# Patient Record
Sex: Male | Born: 1955 | Race: Black or African American | Hispanic: No | Smoking: Never smoker
Health system: Southern US, Community
[De-identification: ages and names within clinical notes are randomized; demographics above are authoritative.]

## PROBLEM LIST (undated history)

## (undated) DIAGNOSIS — C801 Malignant (primary) neoplasm, unspecified: Secondary | ICD-10-CM

## (undated) DIAGNOSIS — D099 Carcinoma in situ, unspecified: Secondary | ICD-10-CM

---

## 2006-08-06 ENCOUNTER — Encounter: Admission: RE | Admit: 2006-08-06 | Discharge: 2006-08-06 | Payer: Self-pay | Admitting: Orthopedic Surgery

## 2018-09-30 ENCOUNTER — Emergency Department (HOSPITAL_COMMUNITY)
Admission: EM | Admit: 2018-09-30 | Discharge: 2018-09-30 | Disposition: A | Discharge status: Home / Self Care | Payer: Self-pay | Attending: Emergency Medicine | Admitting: Emergency Medicine

## 2018-09-30 ENCOUNTER — Encounter (HOSPITAL_COMMUNITY): Payer: Self-pay | Admitting: Emergency Medicine

## 2018-09-30 ENCOUNTER — Other Ambulatory Visit: Payer: Self-pay

## 2018-09-30 DIAGNOSIS — H02402 Unspecified ptosis of left eyelid: Secondary | ICD-10-CM

## 2018-09-30 DIAGNOSIS — R221 Localized swelling, mass and lump, neck: Secondary | ICD-10-CM

## 2018-09-30 DIAGNOSIS — R220 Localized swelling, mass and lump, head: Secondary | ICD-10-CM | POA: Insufficient documentation

## 2018-09-30 NOTE — Discharge Instructions (Addendum)
You need to follow-up with an ophthalmologist for your drooping eyelid.  You also need to have your neck mass further evaluated, and likely biopsied.  Please contact the ENT listed.

## 2018-09-30 NOTE — ED Notes (Signed)
Pt stable, ambulatory, and verbalizes understanding of d/c instructions.  

## 2018-09-30 NOTE — ED Provider Notes (Signed)
[facility] EMERGENCY DEPARTMENT Provider Note   CSN: [id number] Arrival date & time: 09/30/18  2119     History   Chief Complaint No chief complaint on file.   HPI Jack Price is a 62 y.o. male.  Patient presents to the emergency department with a chief complaint of droopy left eyelid.  He states that he noticed the symptoms yesterday morning while looking in the mirror.  He denies any double vision, blurry vision, pain, numbness, weakness, or tingling.  Denies any history of Bell's palsy or stroke.  Denies any headache.  Denies any trauma.  No treatments prior to arrival.  Additionally, states that he has had a mass on the left side of his neck for the past 4 months.  He has not had this evaluated.  It is not painful.  The history is provided by the patient. No language interpreter was used.    History reviewed. No pertinent past medical history.  There are no active problems to display for this patient.       Home Medications    Prior to Admission medications   Not on File    Family History History reviewed. No pertinent family history.  Social History Social History   Tobacco Use  . Smoking status: Not on file  Substance Use Topics  . Alcohol use: Not on file  . Drug use: Not on file     Allergies   Patient has no known allergies.   Review of Systems Review of Systems  All other systems reviewed and are negative.    Physical Exam Updated Vital Signs BP (!) 142/83 (BP Location: Right Arm)   Pulse 79   Temp 97.7 F (36.5 C) (Oral)   Resp 19   Ht 5\' 11"  (1.803 m)   Wt 90.7 kg   SpO2 97%   BMI 27.89 kg/m   Physical Exam Vitals signs and nursing note reviewed.  Constitutional:      Appearance: He is well-developed.  HENT:     Head: Normocephalic and atraumatic.     Comments: Normal creases in forehead, normal nasolabial fold, no facial droop Eyes:     General: No scleral icterus.       Right eye: No discharge.          Left eye: No discharge.     Conjunctiva/sclera: Conjunctivae normal.     Pupils: Pupils are equal, round, and reactive to light.     Comments: Left-sided ptosis, normal EOMs, PERRL, diminished crease in left upper eyelid, worse with downward gaze, no stye or sign of infection  Neck:     Musculoskeletal: Normal range of motion and neck supple.     Vascular: No JVD.  Cardiovascular:     Rate and Rhythm: Normal rate and regular rhythm.     Heart sounds: Normal heart sounds. No murmur. No friction rub. No gallop.   Pulmonary:     Effort: Pulmonary effort is normal. No respiratory distress.     Breath sounds: Normal breath sounds. No wheezing or rales.  Chest:     Chest wall: No tenderness.  Abdominal:     General: There is no distension.     Palpations: Abdomen is soft. There is no mass.     Tenderness: There is no abdominal tenderness. There is no guarding or rebound.  Musculoskeletal: Normal range of motion.        General: No tenderness.  Skin:    General: Skin is warm and dry.  Neurological:     Mental Status: He is alert and oriented to person, place, and time.  Psychiatric:        Behavior: Behavior normal.        Thought Content: Thought content normal.        Judgment: Judgment normal.      ED Treatments / Results  Labs (all labs ordered are listed, but only abnormal results are displayed) Labs Reviewed - No data to display  EKG None  Radiology No results found.  Procedures Procedures (including critical care time)  Medications Ordered in ED Medications - No data to display   Initial Impression / Assessment and Plan / ED Course  I have reviewed the triage vital signs and the nursing notes.  Pertinent labs & imaging results that were available during my care of the patient were reviewed by me and considered in my medical decision making (see chart for details).    Patient with left sided ptosis.  Noticed the drooping yesterday morning in the mirror.   Denies any double vision, denies any pain.  There is no softening of his nasolabial fold or forehead lines, doubt Bell's palsy, doubt stroke, no weakness, numbness, or tingling.  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  Doubt Horner syndrome or 3rd nerve palsy.  Regarding the patient's left-sided neck mass, suspect that this is a nontender lymph node, given the length of time that it has been present (4 months), will recommend that he have follow-up with ENT for biopsy and further work-up.  Patient understands and agrees with plan.  Patient seen by discussed with Dr. Winfred Leeds, who offered additional workup for the neck mass with labs and CT, but patient declined.  Stressed outpatient follow-up.  Final Clinical Impressions(s) / ED Diagnoses   Final diagnoses:  Ptosis of left eyelid  Mass of neck    ED Discharge Orders    None       Montine Circle, PA-C 09/30/18 2258    Orlie Dakin, MD 10/01/18 [phone]

## 2018-09-30 NOTE — ED Triage Notes (Signed)
Pt states he has had a lump on the left side of his neck for a month or so. He noticed that Tuesday morning he noticed left eye lid weakness. No other symptoms. He states he can see fine just the lid is dragging. No ear pain or history of Bell Palsy

## 2018-09-30 NOTE — ED Provider Notes (Addendum)
She reports drooping of his left upper eyelid for the past 4 days.  He also reports a nonpainful mass in his left neck for the past 4 months.  He denies any difficulty swallowing denies visual changes denies difficulty speaking denies fever denies voice changes or difficulty breathing.  No other associated complaint.  On exam patient is alert awake Glasgow Coma Score 15 HEENT there is mild ptosis of left upper eyelid otherwise no facial asymmetry.  Extraocular muscles are intact pupils equal round reactive to light.  Neck there is a golf ball size mass at the left anterolateral neck which is nontender and movable.   Offered diagnostic testing to the patient including blood work and CT imaging which he declines.  He prefers referral to ENT specialist and have outpatient evaluation from office.   I did advise pt possibiilty of tumor in his neck .  He does not wish to have work up now and I feel no emergent work up needed, however needs folllow up. P understands and agrees  Orlie Dakin, MD 09/30/18 2924    Orlie Dakin, MD 10/01/18 [phone]

## 2019-02-10 ENCOUNTER — Telehealth: Payer: Self-pay | Admitting: *Deleted

## 2019-02-10 ENCOUNTER — Other Ambulatory Visit: Payer: Self-pay | Admitting: Radiation Oncology

## 2019-02-10 ENCOUNTER — Other Ambulatory Visit: Payer: Self-pay | Admitting: *Deleted

## 2019-02-10 DIAGNOSIS — D4989 Neoplasm of unspecified behavior of other specified sites: Secondary | ICD-10-CM

## 2019-02-10 NOTE — Telephone Encounter (Signed)
Oncology Nurse Navigator Documentation  Placed introductory call to new referral patient Jack Price.  Introduced myself as the H&N oncology nurse navigator that works with Drs. Isidore Moos and Maylon Peppers to whom he has been referred by ENT Dr. Janace Hoard.  He confirmed understanding of referral.  Briefly explained my role as his navigator, provided my contact information.   Confirmed understanding of appts to be scheduled: PET at [facility] followed by appts with Drs. Tillie Fantasia, baseline audiology s/p appt with Dr. Maylon Peppers and his recommendation of HD cisplatin.  I explained the purpose of pre-radiotherapy dental evaluation, indicated he would be contacted by [place] to arrange an appt within a day or so of his appt with Dr. Isidore Moos.   He confirmed understanding of [place] and [place] location, voiced understanding COVID-19 mitigation arrival and registration procedures.    I encouraged him to call with questions/concerns as he moves forward with appts and procedures.    He verbalized understanding of information provided, expressed appreciation for my call.  Navigator Initial Assessment . 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 . Support System: Lives with fiance. Marland Kitchen PCP:   . PCD:  None, has not seen dentist since his died several years ago.  Indicated teeth in "excellent" condition, denied problems. . Transportation Needs: No . Sensory Deficits/Language Barriers/Interpreter Needed:  No . Ambulation Needs: No . DME Used in Home:  No . Psychosocial Needs: No  . Concerns/Needs Understanding Cancer:  Navigator answered questions. . Self-Expressed Needs: No  Gayleen Orem, RN, BSN Head & Neck Oncology Nurse [place] at [place] [phone]

## 2019-02-11 ENCOUNTER — Ambulatory Visit
Admission: RE | Admit: 2019-02-11 | Discharge: 2019-02-11 | Disposition: A | Discharge status: Home / Self Care | Payer: Self-pay | Source: Ambulatory Visit | Attending: Radiation Oncology | Admitting: Radiation Oncology

## 2019-02-11 ENCOUNTER — Other Ambulatory Visit: Payer: Self-pay | Admitting: Radiation Oncology

## 2019-02-11 DIAGNOSIS — R221 Localized swelling, mass and lump, neck: Secondary | ICD-10-CM

## 2019-02-15 ENCOUNTER — Telehealth: Payer: Self-pay | Admitting: Radiation Oncology

## 2019-02-15 ENCOUNTER — Telehealth: Payer: Self-pay | Admitting: *Deleted

## 2019-02-15 NOTE — Telephone Encounter (Signed)
New Message: ° ° °LVM for patient to return call to schedule appt from referral received. °

## 2019-02-15 NOTE — Telephone Encounter (Signed)
Oncology Nurse Navigator Documentation  In follow-up to conversation with Mr. Gerry Friday of last week and his indication PET scheduled for 6/2, I spoke with Hiram Comber, Radiology Centralized Scheduling, coordinated reschedule for 5/12 7:00.  Per her guidance, I will call again tomorrow morning to see if this Thursday 4:00 is viable option.  LVMM for Mr. Desena with the above information.  Gayleen Orem, RN, BSN Head & Neck Oncology Nurse [place] at [place] [phone]

## 2019-02-17 ENCOUNTER — Telehealth: Payer: Self-pay | Admitting: *Deleted

## 2019-02-17 NOTE — Telephone Encounter (Signed)
Oncology Nurse Navigator Documentation  Called Jack Price to inform him of:  Tomorrow's confirmed PET at [facility], 10:00 arrival, low-carb meal this evening, NPO beginning midnight except for water sips with morning medications.  He confirmed he is not diabetic.  Friday's confirmed 2:30 NE, 3:00 Consult with Dr. Isidore Moos.  Friday's tentative 9:00 Consult with Dr. Maylon Peppers, [facility].  He voiced understanding I will call him tomorrow to confirm. He confirmed understanding of facility locations and my explanation of arrival/registration procedures.  Gayleen Orem, RN, BSN Head & Neck Oncology Nurse [place] at [place] [phone]

## 2019-02-18 ENCOUNTER — Other Ambulatory Visit: Payer: Self-pay | Admitting: Hematology

## 2019-02-18 ENCOUNTER — Other Ambulatory Visit: Payer: Self-pay

## 2019-02-18 ENCOUNTER — Ambulatory Visit (HOSPITAL_COMMUNITY)
Admission: RE | Admit: 2019-02-18 | Discharge: 2019-02-18 | Disposition: A | Discharge status: Home / Self Care | Payer: BLUE CROSS/BLUE SHIELD | Source: Ambulatory Visit | Attending: Radiation Oncology | Admitting: Radiation Oncology

## 2019-02-18 ENCOUNTER — Encounter: Payer: Self-pay | Admitting: *Deleted

## 2019-02-18 DIAGNOSIS — C01 Malignant neoplasm of base of tongue: Secondary | ICD-10-CM

## 2019-02-18 DIAGNOSIS — D4989 Neoplasm of unspecified behavior of other specified sites: Secondary | ICD-10-CM | POA: Diagnosis present

## 2019-02-18 LAB — GLUCOSE, CAPILLARY: Glucose-Capillary: 94 mg/dL (ref 70–99)

## 2019-02-18 MED ORDER — FLUDEOXYGLUCOSE F - 18 (FDG) INJECTION
9.9500 | Freq: Once | INTRAVENOUS | Status: AC | PRN
  Administered 2019-02-18: 9.95 via INTRAVENOUS

## 2019-02-18 NOTE — Progress Notes (Signed)
New patient packet give to Dr. Lanell Persons nurse.

## 2019-02-18 NOTE — Progress Notes (Addendum)
Jack Price NOTE  Patient Care Team: Patient, [name] as PCP - General (General Practice) Melissa Montane, MD as Consulting Physician (Otolaryngology) Eppie Gibson, MD as Attending Physician (Radiation Oncology) Tish Men, MD as Consulting Physician (Hematology) Leota Sauers, RN as Oncology Nurse Navigator  HEME/ONC OVERVIEW: 1. 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, stage TBD  -12/2018: CT neck showed a left BOT lesion extending to the left vallecula, large left Level II/III multilobulated necrotic nodal conglomerate up to 5.3cm, resulting in encasement and displacement of the left carotid sheath, occlusion of the left IJ vein, and it is inseparable from multiple adjacent structures; suspicious for R cervical LN involvement  -01/2019: FNA of the left cervical LN showed poorly differentiated carcinoma, favoring squamous cell carcinoma -02/2019: PET showed FDG-avid left tongue lesion with advanced, bulky bilateral cervical LN disease, as well as a 3.2cm FDG-avid liver lesion, concerning for metastatic disease   ASSESSMENT & PLAN:   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 Janace Hoard of ENT in 12/2018 for evaluation of enlarging left neck mass x 9 months, and CT neck showed suspicious malignancy in the left base of the tongue with extensive left cervical lymph node involvement, resulting in encasement of displacement and displacement of the left carotid sheath, occlusion of the left IJ vein, and is inseparable from multiple adjacent structures.  In addition, there was suspicion for right cervical lymph node involvement.  Unfortunately, PET on 02/18/2019 showed  FDG-avid left  tongue lesion with advanced, bulky bilateral cervical LN disease, as well as a 3.2cm FDG-avid liver lesion, concerning for metastatic disease  -I reviewed pathology imaging results in detail with the patient -We also discussed at length the NCCN guideline -Given the suspicious liver lesion in the setting of very locally advanced, poorly differentiated carcinoma of the base of the tongue, this is very concerning for metastatic disease; alternatively, this may represent a second primary malignancy or its metastasis, but PET did not show any evidence of cirrhosis, which would raise the possibility of HCC, or other significant primary lesions, such lung, stomach, colon or prostate -I have ordered AFP today; if elevated, then we may consider MRI abdomen to evaluate for possible HCC -However, if AFP is not significantly elevated, I would pursue CT-guided liver bx to assess for possible metastatic disease -If metastatic disease is confirmed, I would recommend starting with systemic therapy first, and the disease is stable or improving, then we can consider adding consolidative RT or salvage surgery (to the primary site or oligometastasis) -After the lengthy discussion, the patient expressed understanding and agreed with the plan to pursue liver biopsy next if AFP is not significantly elevated   Loss of consciousness -Patient reports passing out for 20 minutes in the early morning but did not have any bladder or bowel incontinence -I suspect it may be 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, causing transient decreased blood flow when the patient stood up from the bed -However, given the very locally advanced tumor, metastatic disease is a concern -Therefore, I have ordered STAT head CT w/o contrast to rule out any acute intracranial lesions  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 CT HEAD WO CONTRAST    Standing Status:   Future    Standing Expiration Date:   02/19/2020    Order Specific Question:   Preferred imaging location?    Answer:   [facility] Specific Question:   Radiology Contrast Protocol - do NOT remove file path    Answer:   \\[email or web address].[email or web address]  . DG Knee Complete 4 Views Left    Standing Status:   Future    Number of Occurrences:   1    Standing Expiration Date:   04/20/2020    Order Specific Question:   Reason for Exam (SYMPTOM  OR DIAGNOSIS REQUIRED)    Answer:   Left knee pain following fall    Order Specific Question:   Preferred imaging location?    Answer:   [facility] Specific Question:   Radiology Contrast Protocol - do NOT remove file path    Answer:   \\[email or web address]\DXFluoroContrastProtocols.pdf   All questions were answered. The patient knows to call the clinic with any problems, questions or concerns.  Return to be determined, pending the AFP level and if the patient requires liver bx.   Tish Men, MD 02/19/2019 10:25 AM   CHIEF COMPLAINTS/PURPOSE OF CONSULTATION:  "I passed out this morning"  HISTORY OF PRESENTING ILLNESS:  Jack Price 63 y.o. 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  Patient presented to Dr. Janace Hoard of ENT in 12/2018 for evaluation of an enlarging  left neck mass x 9 months, associated with mild dysphagia.  He denied any associated fever, night sweats, or weight change.  CT neck showed suspicious primary malignancy in the left base of the tongue, with extensive left cervical nodal disease resulting in encasement of left carotid sheath, occlusion of the left IJ vein, and is inseparable from multiple adjacent structures.  Patient underwent FNA of the left cervical lymph node in 01/2019, which showed poorly differentiated carcinoma, favor squamous cell carcinoma.  Patient reports that he was getting up this morning to use the bathroom, felt a wave of nausea, and passed out.  He woke up 20 minutes later on the floor.  He denied any preceding headache, vision change, and after the fall, he did not have any bladder or bowel incontinence or unilateral extremity weakness/numbness/tingling.  He bruised his left knee, and his knee and left knee remains very sore this morning clinic.  He is able to bear some weight ,but has limited range of motion in the left knee.  I have reviewed his chart and materials related to his cancer extensively and collaborated history with the patient. Summary of oncologic history is as follows:   Primary squamous cell carcinoma of base of tongue ([place])   01/08/2019 Imaging    CT neck: Findings are highly concerning for left tongue base malignancy with the largest  left level II/III metastatic nodal conglomerate measuring up to 5.3 cm in greatest axial dimension resulting in encasement and displacement of the left carotid sheath, occlusion of the left internal jugular vein, and is inseparable from multiple adjacent structures as detailed in the body of the report.  Findings are concerning for additional right level IIA, right level II/III, left level III, right level IV, and possibly subcentimeter left level V nodal metastases as detailed above. PET/CT could further evaluate the extent of metastatic disease if clinically warranted.     01/21/2019 Pathology Results    Final Cytologic Interpretation  Left neck, Fine Needle Aspiration I (smears and cell block): Porrly differentiatd carcinoma, favor squamous cell carcinoma. See comment.    02/18/2019 Imaging    PET: IMPRESSION: 16 mm lesion at the left base of tongue, corresponding to the patient's known primary head/neck malignancy, similar to prior CT.  Bilateral cervical nodal metastases, as described above, including a dominant 5.3 cm left level 2/3 nodal mass, grossly unchanged.  3.2 cm hepatic metastasis in segment 4A.     MEDICAL HISTORY:  History reviewed. No pertinent past medical history.  SURGICAL HISTORY: History reviewed. No pertinent surgical history.  SOCIAL HISTORY: Social History   Socioeconomic History  . Marital status: Significant Other    Spouse name: Not on file  . Number of children: Not on file  . Years of education: Not on file  . Highest education level: Not on file  Occupational History  . Not on file  Social Needs  . Financial resource strain: Not on file  . Food insecurity:    Worry: Not on file    Inability: Not on file  . Transportation needs:    Medical: Not on file    Non-medical: Not on file  Tobacco Use  . Smoking status: Never Smoker  . Smokeless tobacco: Never Used  Substance and Sexual Activity  . Alcohol use: Not Currently  . Drug use: Never  . Sexual activity: Yes  Lifestyle  . Physical activity:    Days per week: Not on file    Minutes per session: Not on file  . Stress: Not on file  Relationships  . Social connections:    Talks on phone: Not on file    Gets together: Not on file    Attends religious service: Not on file    Active member of club or organization: Not on file    Attends meetings of clubs or organizations: Not on file    Relationship status: Not on file  . Intimate partner violence:    Fear of current or ex partner: Not on file    Emotionally abused: Not on file     Physically abused: Not on file    Forced sexual activity: Not on file  Other Topics Concern  . Not on file  Social History Narrative  . Not on file    FAMILY HISTORY: History reviewed. No pertinent family history.  ALLERGIES:  is allergic to iodinated diagnostic agents.  MEDICATIONS:  Current Outpatient Medications  Medication Sig Dispense Refill  . acetaminophen (TYLENOL) 325 MG tablet Take 650 mg by mouth every 4 (four) hours as needed for mild pain.     No current facility-administered medications for this visit.     REVIEW OF SYSTEMS:   Constitutional: ( - ) fevers, ( - )  chills , ( - ) night sweats Eyes: ( - ) blurriness of vision, ( - ) double vision, ( - ) watery  eyes Ears, nose, mouth, throat, and face: ( - ) mucositis, ( - ) sore throat Respiratory: ( - ) cough, ( - ) dyspnea, ( - ) wheezes Cardiovascular: ( - ) palpitation, ( - ) chest discomfort, ( - ) lower extremity swelling Gastrointestinal:  ( - ) nausea, ( - ) heartburn, ( - ) change in bowel habits Skin: ( - ) abnormal skin rashes Lymphatics: ( - ) new lymphadenopathy, ( - ) easy bruising Neurological: ( - ) numbness, ( - ) tingling, ( - ) new weaknesses Behavioral/Psych: ( - ) mood change, ( - ) new changes  All other systems were reviewed with the patient and are negative.  PHYSICAL EXAMINATION: ECOG PERFORMANCE STATUS: 0 - Asymptomatic  Vitals:   02/19/19 0925  BP: (!) 145/93  Pulse: 72  Resp: 18  Temp: 99 F (37.2 C)  SpO2: 100%   Filed Weights   02/19/19 0925  Weight: 203 lb 12.8 oz (92.4 kg)    GENERAL: alert, no distress and comfortable SKIN: skin color, texture, turgor are normal, no rashes or significant lesions EYES: slightly droopy left eyelid, slightly sluggish left pupil response to light, consistent with Horner's syndrome  OROPHARYNX: no exudate, no erythema; lips, buccal mucosa, and tongue normal  NECK: supple, non-tender LYMPH:  Bulky bilateral cervical adenopathy, L > R; large  firm mass in the left neck, fixed  LUNGS: clear to auscultation with normal breathing effort HEART: regular rate & rhythm, no murmurs, no lower extremity edema ABDOMEN: soft, non-tender, non-distended, normal bowel sounds Musculoskeletal: no cyanosis of digits and no clubbing  PSYCH: alert & oriented x 3, fluent speech NEURO: no focal motor/sensory deficits  LABORATORY DATA:  I have reviewed the data as listed Lab Results  Component Value Date   WBC 8.7 02/19/2019   HGB 13.6 02/19/2019   HCT 40.1 02/19/2019   MCV 95.5 02/19/2019   PLT 208 02/19/2019   Lab Results  Component Value Date   NA 136 02/19/2019   K 4.0 02/19/2019   CL 101 02/19/2019   CO2 27 02/19/2019    RADIOGRAPHIC STUDIES: I have personally reviewed the radiological images as listed and agreed with the findings in the report. Nm Pet Image Initial (pi) Skull Base To Thigh  Result Date: 02/18/2019 CLINICAL DATA:  Subsequent treatment strategy for left base of tongue/left neck squamous cell cancer. EXAM: NUCLEAR MEDICINE PET SKULL BASE TO THIGH TECHNIQUE: 9.95 mCi F-18 FDG was injected intravenously. Full-ring PET imaging was performed from the skull base to thigh after the radiotracer. CT data was obtained and used for attenuation correction and anatomic localization. Fasting blood glucose: 94 mg/dl COMPARISON:  [facility] CT neck dated 01/08/2019 FINDINGS: Mediastinal blood pool activity: SUV max 2.5 NECK: 16 mm lesion at the left base of tongue (series 4/image 30), max SUV 24.9, likely corresponding to the patient's known primary head/neck malignancy. This previously measured 18 mm on CT. 5.3 x 3.7 cm left level 2/3 aggregate nodal mass, partially necrotic (series 4/image 32), max SUV 18.0. This previously measured 5.4 x 3.8 cm on CT. Additional bilateral cervical lymphadenopathy, measuring up to 12 mm short axis on the right (series 4/image 29) and 12 mm short on the left (series 4/image 38), better evaluated on  CT. Max SUV 5.5 on the right, max SUV 7.3 on the left. Incidental CT findings: none CHEST: No suspicious pulmonary nodules. Mild linear scarring/atelectasis in the bilateral lower lobes. No hypermetabolic thoracic lymphadenopathy. Incidental CT findings: Mild coronary  atherosclerosis in the left circumflex. ABDOMEN/PELVIS: 3.2 cm mass in segment 4A (series 4/image 106), max SUV 10.7, compatible with metastasis. No abnormal hypermetabolism in the spleen, pancreas, or adrenal glands. No hypermetabolic abdominopelvic lymphadenopathy. Incidental CT findings: Left renal sinus cysts. Atherosclerotic calcifications the abdominal aorta and branch vessels. Thick-walled bladder, although underdistended. SKELETON: No focal hypermetabolic activity to suggest skeletal metastasis. Incidental CT findings: Degenerative changes of the visualized thoracolumbar spine. IMPRESSION: 16 mm lesion at the left base of tongue, corresponding to the patient's known primary head/neck malignancy, similar to prior CT. Bilateral cervical nodal metastases, as described above, including a dominant 5.3 cm left level 2/3 nodal mass, grossly unchanged. 3.2 cm hepatic metastasis in segment 4A. Electronically Signed   By: Julian Hy M.D.   On: 02/18/2019 14:13    PATHOLOGY: I have reviewed the pathology reports as documented in the oncologist history.

## 2019-02-19 ENCOUNTER — Telehealth: Payer: Self-pay | Admitting: *Deleted

## 2019-02-19 ENCOUNTER — Ambulatory Visit
Admission: RE | Admit: 2019-02-19 | Discharge: 2019-02-19 | Disposition: A | Discharge status: Home / Self Care | Payer: BLUE CROSS/BLUE SHIELD | Source: Ambulatory Visit | Attending: Radiation Oncology | Admitting: Radiation Oncology

## 2019-02-19 ENCOUNTER — Encounter: Payer: Self-pay | Admitting: Radiation Oncology

## 2019-02-19 ENCOUNTER — Inpatient Hospital Stay (HOSPITAL_BASED_OUTPATIENT_CLINIC_OR_DEPARTMENT_OTHER): Payer: BLUE CROSS/BLUE SHIELD | Admitting: Hematology

## 2019-02-19 ENCOUNTER — Ambulatory Visit (HOSPITAL_BASED_OUTPATIENT_CLINIC_OR_DEPARTMENT_OTHER)
Admission: RE | Admit: 2019-02-19 | Discharge: 2019-02-19 | Disposition: A | Discharge status: Home / Self Care | Payer: BLUE CROSS/BLUE SHIELD | Source: Ambulatory Visit | Attending: Hematology | Admitting: Hematology

## 2019-02-19 ENCOUNTER — Other Ambulatory Visit: Payer: Self-pay

## 2019-02-19 ENCOUNTER — Inpatient Hospital Stay: Payer: BLUE CROSS/BLUE SHIELD | Attending: Hematology

## 2019-02-19 ENCOUNTER — Other Ambulatory Visit: Payer: Self-pay | Admitting: Hematology

## 2019-02-19 ENCOUNTER — Encounter: Payer: Self-pay | Admitting: Hematology

## 2019-02-19 VITALS — BP 145/93 | HR 72 | Temp 99.0°F | Resp 18 | Ht 71.0 in | Wt 203.8 lb

## 2019-02-19 DIAGNOSIS — E039 Hypothyroidism, unspecified: Secondary | ICD-10-CM

## 2019-02-19 DIAGNOSIS — G902 Horner's syndrome: Secondary | ICD-10-CM | POA: Diagnosis not present

## 2019-02-19 DIAGNOSIS — K769 Liver disease, unspecified: Secondary | ICD-10-CM

## 2019-02-19 DIAGNOSIS — M25562 Pain in left knee: Secondary | ICD-10-CM | POA: Diagnosis present

## 2019-02-19 DIAGNOSIS — C01 Malignant neoplasm of base of tongue: Secondary | ICD-10-CM

## 2019-02-19 DIAGNOSIS — Z79899 Other long term (current) drug therapy: Secondary | ICD-10-CM | POA: Insufficient documentation

## 2019-02-19 DIAGNOSIS — C770 Secondary and unspecified malignant neoplasm of lymph nodes of head, face and neck: Secondary | ICD-10-CM

## 2019-02-19 DIAGNOSIS — R05 Cough: Secondary | ICD-10-CM | POA: Diagnosis not present

## 2019-02-19 DIAGNOSIS — R4020 Unspecified coma: Secondary | ICD-10-CM

## 2019-02-19 DIAGNOSIS — C787 Secondary malignant neoplasm of liver and intrahepatic bile duct: Secondary | ICD-10-CM

## 2019-02-19 LAB — CMP (CANCER CENTER ONLY)
ALT: 20 U/L (ref 0–44)
AST: 18 U/L (ref 15–41)
Albumin: 4.9 g/dL (ref 3.5–5.0)
Alkaline Phosphatase: 67 U/L (ref 38–126)
Anion gap: 8 (ref 5–15)
BUN: 17 mg/dL (ref 8–23)
CO2: 27 mmol/L (ref 22–32)
Calcium: 10.7 mg/dL — ABNORMAL HIGH (ref 8.9–10.3)
Chloride: 101 mmol/L (ref 98–111)
Creatinine: 1.19 mg/dL (ref 0.61–1.24)
GFR, Est AFR Am: >60 mL/min (ref >60)
GFR, Estimated: >60 mL/min (ref >60)
Glucose, Bld: 100 mg/dL — ABNORMAL HIGH (ref 70–99)
Potassium: 4 mmol/L (ref 3.5–5.1)
Sodium: 136 mmol/L (ref 135–145)
Total Bilirubin: 0.6 mg/dL (ref 0.3–1.2)
Total Protein: 8.1 g/dL (ref 6.5–8.1)

## 2019-02-19 LAB — CBC WITH DIFFERENTIAL (CANCER CENTER ONLY)
Abs Immature Granulocytes: 0.02 10*3/uL (ref 0.00–0.07)
Basophils Absolute: 0 10*3/uL (ref 0.0–0.1)
Basophils Relative: 0 %
Eosinophils Absolute: 0.1 10*3/uL (ref 0.0–0.5)
Eosinophils Relative: 1 %
HCT: 40.1 % (ref 39.0–52.0)
Hemoglobin: 13.6 g/dL (ref 13.0–17.0)
Immature Granulocytes: 0 %
Lymphocytes Relative: 7 %
Lymphs Abs: 0.6 10*3/uL — ABNORMAL LOW (ref 0.7–4.0)
MCH: 32.4 pg (ref 26.0–34.0)
MCHC: 33.9 g/dL (ref 30.0–36.0)
MCV: 95.5 fL (ref 80.0–100.0)
Monocytes Absolute: 0.7 10*3/uL (ref 0.1–1.0)
Monocytes Relative: 9 %
Neutro Abs: 7.2 10*3/uL (ref 1.7–7.7)
Neutrophils Relative %: 83 %
Platelet Count: 208 10*3/uL (ref 150–400)
RBC: 4.2 MIL/uL — ABNORMAL LOW (ref 4.22–5.81)
RDW: 11.9 % (ref 11.5–15.5)
WBC Count: 8.7 10*3/uL (ref 4.0–10.5)
nRBC: 0 % (ref 0.0–0.2)

## 2019-02-19 LAB — TSH: TSH: 4.251 u[IU]/mL — ABNORMAL HIGH (ref 0.320–4.118)

## 2019-02-19 LAB — T4, FREE: Free T4: 0.77 ng/dL — ABNORMAL LOW (ref 0.82–1.77)

## 2019-02-19 MED ORDER — LEVOTHYROXINE SODIUM 50 MCG PO TABS: 50.0000 ug | ORAL_TABLET | Freq: Every day | ORAL | 3 refills | Status: DC

## 2019-02-19 NOTE — Telephone Encounter (Signed)
-----   Message from Tish Men, MD sent at 02/19/2019 12:28 PM EDT ----- Can we let Mr. Hott know that his knee x-ray just showed arthritis but no acute fracture? He needs to rest it, alternate head and cold packs, and take Tylenol as needed.  Thanks.  [place] ----- Message ----- From: Interface, Rad Results In Sent: 02/19/2019  12:20 PM EDT To: Tish Men, MD

## 2019-02-19 NOTE — Telephone Encounter (Signed)
As noted below by Dr. Maylon Peppers, I informed the patient of his xray results of his left knee. It showed arthritis but no fracture. You need to rest the knee, alternate heat and cold packs, and take Tylenol as needed. Informed him to call the office if he has any questions or concerns.

## 2019-02-19 NOTE — Progress Notes (Signed)
Radiation Oncology         [phone] ________________________________  Initial WebEx Consultation  Name: Jack Price MRN: [id number]  Date: 02/19/2019  DOB: May 11, 1956  MP:NTIRWER, [name]  Melissa Montane, MD   REFERRING PHYSICIAN: Melissa Montane, MD  DIAGNOSIS:    ICD-10-CM   1. 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 male who presented with left neck mass since 03/2018. He began to develop dysphagia in 11/2018 and brought it to the attention of ENT.  Subsequently, the patient saw Dr. Janace Hoard on 01/01/2019 who ordered neck CT. The scan occurred on 01/08/2019 and revealed: findings highly concerning for left tongue base malignancy with the largest left level II/III metastatic nodal conglomerate measuring up to 5.3 cm in greatest axial dimension resulting in encasement and displacement of the left carotid sheath, occlusion of the left internal jugular vein, and is inseparable from multiple adjacent structures.  Biopsy of the left neck mass on 01/21/2019 revealed: poorly differentiated carcinoma, favor squamous cell; the cell block contained insufficient tumor cells for further work up.  Pertinent imaging thus far includes PET scan performed on 02/18/2019 revealing: 16 mm lesion at the left base of tongue, similar to prior CT; bilateral cervical nodal metastases, including a dominant 5.3 cm left level 2/3 nodal mass, grossly unchanged; 3.2 cm hepatic metastasis.    I have reviewed his imaging personally and at ENT tumor board.  He met with Dr. Maylon Peppers earlier today and reported a fall with loss of consciousness this morning. A head CT was performed with concern for metastasis, but the scan was normal. Dr. Maylon Peppers ordered labs to include the AFP tumor marker due to concern of the liver lesion.  Suspect metastasis, but Hepatocellular carcinoma is  on the differential.  Swallowing issues, if any: reports he can feel something when he swallows.  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  PREVIOUS RADIATION THERAPY: No  PAST MEDICAL HISTORY:  has no past medical history on file.    PAST SURGICAL HISTORY:History reviewed. No pertinent surgical history.  FAMILY HISTORY: family history is not on file.  SOCIAL HISTORY:  reports that he has never smoked. He has never used smokeless tobacco. He reports current alcohol use of about 3.0 standard drinks of alcohol per week. He reports that he does not use drugs.  ALLERGIES: Iodinated diagnostic agents  MEDICATIONS:  Current Outpatient Medications  Medication Sig Dispense Refill  . acetaminophen (TYLENOL) 325 MG tablet Take 650 mg by mouth every 4 (four) hours as needed for mild pain.    Marland Kitchen levothyroxine (SYNTHROID) 50 MCG tablet Take 1 tablet (50 mcg total) by mouth daily before breakfast for 30 days. (Patient not taking: Reported on 02/19/2019) 30 tablet 3   No current facility-administered medications for this encounter.     REVIEW OF SYSTEMS:  Notable for that above.   PHYSICAL EXAM:  vitals were not taken for this visit.   General: Alert and oriented, in no acute distress  Psychiatric: Judgment and insight are intact. Affect is appropriate.  LABORATORY DATA:  Lab Results  Component Value Date   WBC 8.7 02/19/2019   HGB 13.6 02/19/2019   HCT 40.1 02/19/2019   MCV 95.5 02/19/2019   PLT 208 02/19/2019   CMP     Component Value Date/Time   NA 136 02/19/2019 0903   K 4.0 02/19/2019 0903   CL 101 02/19/2019 0903   CO2 27 02/19/2019 0903   GLUCOSE 100 (H) 02/19/2019 0903   BUN 17 02/19/2019 0903   CREATININE 1.19 02/19/2019  0903   CALCIUM 10.7 (H) 02/19/2019 0903   PROT 8.1 02/19/2019 0903   ALBUMIN 4.9 02/19/2019 0903   AST 18 02/19/2019 0903   ALT 20 02/19/2019 0903   ALKPHOS 67 02/19/2019 0903   BILITOT 0.6 02/19/2019 0903   GFRNONAA >60 02/19/2019 0903   GFRAA >60 02/19/2019 0903      Lab Results  Component Value Date   TSH 4.251 (H) 02/19/2019    Free T4: 02-19-19; 0.77   RADIOGRAPHY: Ct Head Wo Contrast  Result Date: 02/19/2019 CLINICAL DATA:  Loss of consciousness after fall today. EXAM: CT HEAD WITHOUT CONTRAST TECHNIQUE: Contiguous axial images were obtained from the base of the skull through the vertex without intravenous contrast. COMPARISON:  None. FINDINGS: Brain: No evidence of acute infarction, hemorrhage, hydrocephalus, extra-axial collection or mass lesion/mass effect. Vascular: No hyperdense vessel or unexpected calcification. Skull: Normal. Negative for fracture or focal lesion. Sinuses/Orbits: No acute finding. Other: None. IMPRESSION: Normal head CT. Electronically Signed   By: Marijo Conception M.D.   On: 02/19/2019 10:51   Nm Pet Image Initial (pi) Skull Base To Thigh  Result Date: 02/18/2019 CLINICAL DATA:  Subsequent treatment strategy for left base of tongue/left neck squamous cell cancer. EXAM: NUCLEAR MEDICINE PET SKULL BASE TO THIGH TECHNIQUE: 9.95 mCi F-18 FDG was injected intravenously. Full-ring PET imaging was performed from the skull base to thigh after the radiotracer. CT data was obtained and used for attenuation correction and anatomic localization. Fasting blood glucose: 94 mg/dl COMPARISON:  [facility] CT neck dated 01/08/2019 FINDINGS: Mediastinal blood pool activity: SUV max 2.5 NECK: 16 mm lesion at the left base of tongue (series 4/image 30), max SUV 24.9, likely corresponding to the patient's known primary head/neck malignancy. This previously measured 18 mm on CT. 5.3 x 3.7 cm left level 2/3 aggregate nodal mass, partially necrotic (series 4/image 32), max SUV  18.0. This previously measured 5.4 x 3.8 cm on CT. Additional bilateral cervical lymphadenopathy, measuring up to 12 mm short axis on the right (series 4/image 29) and 12 mm short on the left (series 4/image 38), better evaluated on CT. Max SUV 5.5 on the right, max SUV 7.3 on the left. Incidental CT findings: none CHEST: No suspicious pulmonary nodules. Mild linear scarring/atelectasis in the bilateral lower lobes. No hypermetabolic thoracic lymphadenopathy. Incidental CT findings: Mild coronary atherosclerosis in the left circumflex. ABDOMEN/PELVIS: 3.2 cm mass in segment 4A (series 4/image 106), max SUV 10.7, compatible with metastasis. No abnormal hypermetabolism in the spleen, pancreas, or adrenal glands. No hypermetabolic abdominopelvic lymphadenopathy. Incidental CT findings: Left renal sinus cysts. Atherosclerotic calcifications the abdominal aorta and branch vessels. Thick-walled bladder, although underdistended. SKELETON: No focal hypermetabolic activity to suggest skeletal metastasis. Incidental CT findings: Degenerative changes of the visualized thoracolumbar spine. IMPRESSION: 16 mm lesion at the left base of tongue, corresponding to the patient's known primary head/neck malignancy, similar to prior CT. Bilateral cervical nodal metastases, as described above, including a dominant 5.3 cm left level  2/3 nodal mass, grossly unchanged. 3.2 cm hepatic metastasis in segment 4A. Electronically Signed   By: Julian Hy M.D.   On: 02/18/2019 14:13   Dg Knee Complete 4 Views Left  Result Date: 02/19/2019 CLINICAL DATA:  Acute left knee pain after fall today. EXAM: LEFT KNEE - COMPLETE 4+ VIEW COMPARISON:  None. FINDINGS: No evidence of fracture, dislocation, or joint effusion. Moderate narrowing of medial joint space is noted with osteophyte formation. Soft tissues are unremarkable. 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 No acute abnormality seen in the left knee.  Electronically Signed   By: Marijo Conception M.D.   On: 02/19/2019 12:18      IMPRESSION/PLAN: Metastatic Tongue Base Cancer  This is a delightful patient with head and neck cancer. Liver lesion warrants workup. AFP lab pending. This may be followed by a biopsy.  Dr Maylon Peppers is completing the workup.    If he has metastatic disease, will likely start his treatment with systemic therapy alone.  If the liver lesion is a second primary, he may undergo definitive treatment of the liver lesion (warranting GI tumor board discussion) and ChRT for the neck/throat cancer.  We discussed the potential risks, benefits, and side effects of radiotherapy. We talked in detail about acute and late effects.  We will hold off on simulation until work-up is complete.  It is possible he'll receive systemic therapy alone, and hold RT for now.  Hypothyroidism: Patient denies symptoms.  He doesn't want to take the levothyroxine Rx'd today by Dr Maylon Peppers.  I recommended he discuss this further with him.  He'll need to be monitored with labwork in the future.  Left ptosis and syncope: likely related to left neck mass. Treatment of underlying cause pending oncologic treatment plan.  This encounter was provided by telemedicine platform Webex due to pandemic precautions.  The patient has given verbal consent for this type of encounter and has been advised to only accept a meeting of this type in a secure network environment. The time spent during this encounter was 24 minutes. The attendants for this meeting include Eppie Gibson  and [facility].  Also, Gayleen Orem, RN, our Head and Neck Oncology Navigator was in attendance. During the encounter, Eppie Gibson was located at [facility] Radiation Oncology Department.  Kaylum Shrum was located at home.   __________________________________________   Eppie Gibson, MD   This document serves as a record of services personally performed by Eppie Gibson, MD. It was  created on her behalf by Wilburn Mylar, a trained medical scribe. The creation of this record is based on the scribe's personal observations and the provider's statements to them. This document has been checked and approved by the attending provider.

## 2019-02-19 NOTE — Telephone Encounter (Signed)
As noted below by Dr. Maylon Peppers, I left a message for patient giving him lab results. Also, he has sent him a prescription to his pharmacy. Instructed him to call the office if he has any questions or concerns.

## 2019-02-19 NOTE — Progress Notes (Signed)
Head and Neck Cancer Location of Tumor / Histology:  01/22/19 Final Cytologic Interpretation  Left neck, Fine Needle Aspiration I (smears and cell block): Porrly differentiatd carcinoma, favor squamous cell carcinoma  Patient presented to Dr. Janace Hoard on 01/01/19 with symptoms of: left neck mass for about 9 months. It had been mostly asymptomatic but in the previous 5 weeks he developed slight dysphagia.   Biopsies of left neck revealed: poorly differentiated carcinoma, favor squamous cell carcinoma.   Nutrition Status Yes No Comments  Weight changes? []  [x]    Swallowing concerns? [x]  []  He can feel something when he swallows.   PEG? []  [x]     Referrals Yes No Comments  Social Work? []  [x]    Dentistry? []  [x]    Swallowing therapy? []  [x]    Nutrition? []  [x]    Med/Onc? [x]  []  Dr. Maylon Peppers earlier today   Safety Issues Yes No Comments  Prior radiation? []  [x]    Pacemaker/ICD? []  [x]    Possible current pregnancy? []  [x]    Is the patient on methotrexate? []  [x]     Tobacco/Marijuana/Snuff/ETOH use: He has never smoked. He drinks alcohol monthly.  Past/Anticipated interventions by otolaryngology, if any:  Dr. Janace Hoard initial consult 01/01/19  01/21/19 Dr. Janace Hoard Procedures:  Fine Needle Aspiration   Past/Anticipated interventions by medical oncology, if any:  Dr. Maylon Peppers 02/19/19    Current Complaints / other details:

## 2019-02-19 NOTE — Telephone Encounter (Signed)
-----   Message from Tish Men, MD sent at 02/19/2019  2:10 PM EDT ----- Can we let [name] know that his thyroid function is low, and I have sent a thyroid hormone pill to his pharmacy?Thanks.  [place]  ----- Message ----- From: Buel Ream, Lab In [place] Sent: 02/19/2019   9:14 AM EDT To: Tish Men, MD

## 2019-02-19 NOTE — Progress Notes (Unsigned)
synth 

## 2019-02-20 LAB — AFP TUMOR MARKER: AFP, Serum, Tumor Marker: 2.5 ng/mL (ref 0.0–8.3)

## 2019-02-22 ENCOUNTER — Other Ambulatory Visit: Payer: Self-pay | Admitting: Hematology

## 2019-02-22 ENCOUNTER — Telehealth: Payer: Self-pay | Admitting: *Deleted

## 2019-02-22 ENCOUNTER — Telehealth: Payer: Self-pay | Admitting: Hematology

## 2019-02-22 DIAGNOSIS — C01 Malignant neoplasm of base of tongue: Secondary | ICD-10-CM

## 2019-02-22 NOTE — Telephone Encounter (Signed)
To be determined per 5/8 los

## 2019-02-22 NOTE — Telephone Encounter (Signed)
-----   Message from Tish Men, MD sent at 02/22/2019  9:36 AM EDT ----- Can we let Mr. Gervase know that his AFP was low, and that I have ordered Ct-guided liver bx? Rick, the H&N navigator, will help expedite the scheduling.  Thank you.  [place]  ----- Message ----- From: Buel Ream, Lab In [place] Sent: 02/19/2019   9:14 AM EDT To: Tish Men, MD

## 2019-02-22 NOTE — Telephone Encounter (Signed)
Notified pt of AFP results to expect a call from scheduling regarding liver bx.

## 2019-02-23 ENCOUNTER — Ambulatory Visit (HOSPITAL_COMMUNITY): Payer: BLUE CROSS/BLUE SHIELD

## 2019-02-23 ENCOUNTER — Other Ambulatory Visit: Payer: Self-pay | Admitting: Hematology

## 2019-02-23 ENCOUNTER — Encounter: Payer: Self-pay | Admitting: Radiation Oncology

## 2019-02-23 DIAGNOSIS — C01 Malignant neoplasm of base of tongue: Secondary | ICD-10-CM

## 2019-02-24 ENCOUNTER — Telehealth: Payer: Self-pay | Admitting: *Deleted

## 2019-02-24 NOTE — Telephone Encounter (Addendum)
Oncology Nurse Navigator Documentation  Spoke with [facility], requested PD-L1 on patient's 5/19 bx.    Drs. Isidore Moos and Maylon Peppers updated.  Update 5/14 0819:  Spoke with Luetta Nutting, [facility], requested p16.  Drs. Isidore Moos and Maylon Peppers updated.    Gayleen Orem, RN, BSN Head & Neck Oncology Nurse [place] at [place] [phone]

## 2019-03-01 ENCOUNTER — Other Ambulatory Visit: Payer: Self-pay | Admitting: Student

## 2019-03-01 ENCOUNTER — Encounter: Payer: Self-pay | Admitting: *Deleted

## 2019-03-01 NOTE — Progress Notes (Signed)
Oncology Nurse Navigator Documentation  Met with Jack Price during WebEx consult with Dr. Isidore Moos.  He was unaccompanied. I further explained my role as his navigator and as a member of his Care Team. He voiced understanding of:  Ttreatment plan for L BOT/neck mass dependent on outcome of liver biopsy, appt date pending.  If bx indicates metastasis of BOT carcinoma, will proceed with chemotherapy followed potentially by RT.  I bx indicates liver mass is a new primary he will be referred to GI for consultation, tmt may include surgical excision vs stereotatic RT. I encouraged him to call me with questions/concerns as he moves forward with procedures/appts.  Jack Orem, RN, BSN Head & Neck Oncology Nurse [place] at [place] [phone]

## 2019-03-02 ENCOUNTER — Encounter (HOSPITAL_COMMUNITY): Payer: Self-pay

## 2019-03-02 ENCOUNTER — Other Ambulatory Visit: Payer: Self-pay

## 2019-03-02 ENCOUNTER — Ambulatory Visit (HOSPITAL_COMMUNITY)
Admission: RE | Admit: 2019-03-02 | Discharge: 2019-03-02 | Disposition: A | Discharge status: Home / Self Care | Payer: BLUE CROSS/BLUE SHIELD | Source: Ambulatory Visit | Attending: Hematology | Admitting: Hematology

## 2019-03-02 DIAGNOSIS — C787 Secondary malignant neoplasm of liver and intrahepatic bile duct: Secondary | ICD-10-CM | POA: Insufficient documentation

## 2019-03-02 DIAGNOSIS — C01 Malignant neoplasm of base of tongue: Secondary | ICD-10-CM | POA: Diagnosis not present

## 2019-03-02 DIAGNOSIS — C770 Secondary and unspecified malignant neoplasm of lymph nodes of head, face and neck: Secondary | ICD-10-CM | POA: Insufficient documentation

## 2019-03-02 DIAGNOSIS — K769 Liver disease, unspecified: Secondary | ICD-10-CM | POA: Diagnosis present

## 2019-03-02 HISTORY — DX: Carcinoma in situ, unspecified: D09.9

## 2019-03-02 HISTORY — DX: Malignant (primary) neoplasm, unspecified: C80.1

## 2019-03-02 LAB — PROTIME-INR
INR: 1 (ref 0.8–1.2)
Prothrombin Time: 13.4 seconds (ref 11.4–15.2)

## 2019-03-02 LAB — CBC
HCT: 40.8 % (ref 39.0–52.0)
Hemoglobin: 13.6 g/dL (ref 13.0–17.0)
MCH: 31.9 pg (ref 26.0–34.0)
MCHC: 33.3 g/dL (ref 30.0–36.0)
MCV: 95.6 fL (ref 80.0–100.0)
Platelets: 235 10*3/uL (ref 150–400)
RBC: 4.27 MIL/uL (ref 4.22–5.81)
RDW: 11.7 % (ref 11.5–15.5)
WBC: 5.6 10*3/uL (ref 4.0–10.5)
nRBC: 0 % (ref 0.0–0.2)

## 2019-03-02 MED ORDER — FENTANYL CITRATE (PF) 100 MCG/2ML IJ SOLN
INTRAMUSCULAR | Status: AC | PRN
  Administered 2019-03-02 (×4): 25 ug via INTRAVENOUS
  Administered 2019-03-02: 50 ug via INTRAVENOUS

## 2019-03-02 MED ORDER — FENTANYL CITRATE (PF) 100 MCG/2ML IJ SOLN
INTRAMUSCULAR | Status: AC
  Filled 2019-03-02: qty 2

## 2019-03-02 MED ORDER — GELATIN ABSORBABLE 12-7 MM EX MISC
CUTANEOUS | Status: AC
  Filled 2019-03-02: qty 1

## 2019-03-02 MED ORDER — MIDAZOLAM HCL 2 MG/2ML IJ SOLN
INTRAMUSCULAR | Status: AC
  Filled 2019-03-02: qty 2

## 2019-03-02 MED ORDER — MIDAZOLAM HCL 2 MG/2ML IJ SOLN
INTRAMUSCULAR | Status: AC | PRN
  Administered 2019-03-02: 0.5 mg via INTRAVENOUS
  Administered 2019-03-02: 1 mg via INTRAVENOUS
  Administered 2019-03-02: 0.5 mg via INTRAVENOUS

## 2019-03-02 MED ORDER — SODIUM CHLORIDE 0.9 % IV SOLN: INTRAVENOUS | Status: DC

## 2019-03-02 MED ORDER — SODIUM CHLORIDE 0.9 % IV SOLN
INTRAVENOUS | Status: AC | PRN
  Administered 2019-03-02: 10 mL/h via INTRAVENOUS

## 2019-03-02 MED ORDER — LIDOCAINE HCL (PF) 1 % IJ SOLN
INTRAMUSCULAR | Status: AC
  Filled 2019-03-02: qty 30

## 2019-03-02 MED ORDER — HYDROCODONE-ACETAMINOPHEN 5-325 MG PO TABS: 1.0000 | ORAL_TABLET | ORAL | Status: DC | PRN

## 2019-03-02 NOTE — Sedation Documentation (Signed)
Called to give report. Nurse unavailable. Will call back 

## 2019-03-02 NOTE — Discharge Instructions (Addendum)
Liver Biopsy, Care After °These instructions give you information about how to care for yourself after your procedure. Your health care provider may also give you more specific instructions. If you have problems or questions, contact your health care provider. °What can I expect after the procedure? °After your procedure, it is common to have: °· Pain and soreness in the area where the biopsy was done. °· Bruising around the area where the biopsy was done. °· Sleepiness and fatigue for 1-2 days. °Follow these instructions at home: °Medicines °· Take over-the-counter and prescription medicines only as told by your health care provider. °· If you were prescribed an antibiotic medicine, take it as told by your health care provider. Do not stop taking the antibiotic even if you start to feel better. °· Do not take medicines such as aspirin and ibuprofen unless your health care provider tells you to take them. These medicines thin your blood and can increase the risk of bleeding. °· If you are taking prescription pain medicine, take actions to prevent or treat constipation. Your health care provider may recommend that you: °? Drink enough fluid to keep your urine pale yellow. °? Eat foods that are high in fiber, such as fresh fruits and vegetables, whole grains, and beans. °? Limit foods that are high in fat and processed sugars, such as fried or sweet foods. °? Take an over-the-counter or prescription medicine for constipation. °Incision care °· Follow instructions from your health care provider about how to take care of your incision. Make sure you: °? Wash your hands with soap and water before you change your bandage (dressing). If soap and water are not available, use hand sanitizer. °? Change your dressing as told by your health care provider. °? Leave stitches (sutures), skin glue, or adhesive strips in place. These skin closures may need to stay in place for 2 weeks or longer. If adhesive strip edges start to  loosen and curl up, you may trim the loose edges. Do not remove adhesive strips completely unless your health care provider tells you to do that. °· Check your incision area every day for signs of infection. Check for: °? Redness, swelling, or pain. °? Fluid or blood. °? Warmth. °? Pus or a bad smell. °· Do not take baths, swim, or use a hot tub until your health care provider says it is okay to do so. °Activity ° °· Rest at home for 1-2 days, or as directed by your health care provider. °? Avoid sitting for a long time without moving. Get up to take short walks every 1-2 hours. This is important to improve blood flow and breathing. Ask for help if you feel weak or unsteady. °· Return to your normal activities as told by your health care provider. Ask your health care provider what activities are safe for you. °· Do not drive or use heavy machinery while taking prescription pain medicine. °· Do not lift anything that is heavier than 10 lb (4.5 kg), or the limit that your health care provider tells you, until he or she says that it is safe. °· Do not play contact sports for 2 weeks after the procedure. °General instructions ° °· Do not drink alcohol in the first week after the procedure. °· Have someone stay with you for at least 24 hours after the procedure. °· It is your responsibility to obtain your test results. Ask your health care provider, or the department that is doing the test: °? When will my   results be ready? °? How will I get my results? °? What are my treatment options? °? What other tests do I need? °? What are my next steps? °· Keep all follow-up visits as told by your health care provider. This is important. °Contact a health care provider if: °· You have increased bleeding from an incision, resulting in more than a small spot of blood. °· You have redness, swelling, or increasing pain in any incisions. °· You notice a discharge or a bad smell coming from any of your incisions. °· You have a fever or  chills. °Get help right away if: °· You develop swelling, bloating, or pain in your abdomen. °· You become dizzy or faint. °· You develop a rash. °· You have nausea or you vomit. °· You faint, or you have shortness of breath or difficulty breathing. °· You develop chest pain. °· You have problems with your speech or vision. °· You have trouble with your balance or moving your arms or legs. °Summary °· After the liver biopsy, it is common to have pain, soreness, and bruising in the area, as well as sleepiness and fatigue. °· Take over-the-counter and prescription medicines only as told by your health care provider. °· Follow instructions from your health care provider about how to care for your incision. Check the incision area daily for signs of infection. °This information is not intended to replace advice given to you by your health care provider. Make sure you discuss any questions you have with your health care provider. °Document Released: 04/19/2005 Document Revised: 10/10/2017 Document Reviewed: 10/10/2017 °Elsevier Interactive Patient Education © 2019 [employer] °Moderate Conscious Sedation, Adult, Care After °These instructions provide you with information about caring for yourself after your procedure. Your health care provider may also give you more specific instructions. Your treatment has been planned according to current medical practices, but problems sometimes occur. Call your health care provider if you have any problems or questions after your procedure. °What can I expect after the procedure? °After your procedure, it is common: °· To feel sleepy for several hours. °· To feel clumsy and have poor balance for several hours. °· To have poor judgment for several hours. °· To vomit if you eat too soon. °Follow these instructions at home: °For at least 24 hours after the procedure: ° °· Do not: °? Participate in activities where you could fall or become injured. °? Drive. °? Use heavy  machinery. °? Drink alcohol. °? Take sleeping pills or medicines that cause drowsiness. °? Make important decisions or sign legal documents. °? Take care of children on your own. °· Rest. °Eating and drinking °· Follow the diet recommended by your health care provider. °· If you vomit: °? Drink water, juice, or soup when you can drink without vomiting. °? Make sure you have little or no nausea before eating solid foods. °General instructions °· Have a responsible adult stay with you until you are awake and alert. °· Take over-the-counter and prescription medicines only as told by your health care provider. °· If you smoke, do not smoke without supervision. °· Keep all follow-up visits as told by your health care provider. This is important. °Contact a health care provider if: °· You keep feeling nauseous or you keep vomiting. °· You feel light-headed. °· You develop a rash. °· You have a fever. °Get help right away if: °· You have trouble breathing. °This information is not intended to replace advice given to you by your   health care provider. Make sure you discuss any questions you have with your health care provider. °Document Released: 07/21/2013 Document Revised: 03/04/2016 Document Reviewed: 01/20/2016 °Elsevier Interactive Patient Education © 2019 [employer] ° °

## 2019-03-02 NOTE — H&P (Signed)
Chief Complaint: Patient was seen in consultation today for liver lesion biopsy at the request of [place]  Referring Physician(s): Zhao,Yan  Supervising Physician: Markus Daft  Patient Status: [facility] - Out-pt  History of Present Illness: Jack Price is a 63 y.o. 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 Bilateral cervical nodal metastases, as described above, including a dominant 5.3 cm left level 2/3 nodal mass, grossly unchanged. 3.2 cm hepatic metastasis in segment 4A.  Dr [place] Senate note 02/19/19: Given the suspicious liver lesion in the setting of very locally advanced, poorly differentiated carcinoma of the base of the tongue, this is very concerning for metastatic disease; alternatively, this may represent a second primary malignancy or its metastasis, but PET did not show any evidence of cirrhosis, which would raise the possibility of [place], or other significant primary lesions, such lung, stomach, colon or prostate -I have ordered AFP today; if elevated, then we may consider MRI abdomen to evaluate for possible HCC -However, if AFP is not significantly elevated, I would pursue CT-guided liver bx to assess for possible metastatic disease -If metastatic disease is confirmed, I would recommend starting with systemic therapy first, and the disease is stable or improving, then we can consider adding consolidative RT or salvage surgery (to the primary site or oligometastasis)  Scheduled now for liver lesion biopsy   Past Medical History:  Diagnosis Date   Cancer ([place])    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 No pertinent surgical history.  Allergies: Iodinated diagnostic agents  Medications: Prior to Admission medications   Medication  Sig Start Date End Date Taking? Authorizing Provider  acetaminophen (TYLENOL) 325 MG tablet Take 650 mg by mouth every 4 (four) hours as needed for mild pain. 02/19/19  Yes [provider]  ibuprofen (ADVIL) 200 MG tablet Take 200 mg by mouth every 6 (six) hours as needed for moderate pain.   Yes [provider]  pseudoephedrine (SUDAFED) 60 MG tablet Take 60 mg by mouth every 4 (four) hours as needed for congestion.   Yes [provider]  levothyroxine (SYNTHROID) 50 MCG tablet Take 1 tablet (50 mcg total) by mouth daily before breakfast for 30 days. Patient not taking: Reported on 02/19/2019 02/19/19 03/21/19  Tish Men, MD     Family History  Problem Relation Age of Onset   Dementia Mother    Heart attack Father     Social History   Socioeconomic History   Marital status: Significant Other    Spouse name: Not on file   Number of children: Not on file   Years of education: Not on file   Highest education level: Not on file  Occupational History   Not on file  Social Needs   Financial resource strain: Not on file   Food insecurity:    Worry: Not on file    Inability: Not on file   Transportation needs:    Medical: No    Non-medical: No  Tobacco Use   Smoking status: Never Smoker   Smokeless tobacco: Never Used   Tobacco comment: 3-4 cigars a year  Substance and Sexual Activity   Alcohol use: Not Currently    Alcohol/week: 3.0 standard drinks    Types: 3 Cans of beer  per week   Drug use: Never   Sexual activity: Yes  Lifestyle   Physical activity:    Days per week: Not on file    Minutes per session: Not on file   Stress: Not on file  Relationships   Social connections:    Talks on phone: Not on file    Gets together: Not on file    Attends religious service: Not on file    Active member of club or organization: Not on file    Attends meetings of clubs or organizations: Not on file    Relationship status: Not on file  Other  Topics Concern   Not on file  Social History Narrative   Not on file    Review of Systems: A 12 point ROS discussed and pertinent positives are indicated in the HPI above.  All other systems are negative.  Review of Systems  Constitutional: Negative for activity change, fatigue and fever.  HENT: Positive for trouble swallowing.   Respiratory: Negative for cough and shortness of breath.   Cardiovascular: Negative for chest pain.  Gastrointestinal: Negative for abdominal pain.  Neurological: Negative for weakness.  Psychiatric/Behavioral: Negative for behavioral problems and confusion.    Vital Signs: BP (!) 163/93    Pulse 85    Temp 98 F (36.7 C) (Oral)    Resp 16    Ht 5\' 11"  (1.803 m)    Wt 200 lb (90.7 kg)    SpO2 98%    BMI 27.89 kg/m   Physical Exam Vitals signs reviewed.  Cardiovascular:     Rate and Rhythm: Normal rate and regular rhythm.     Heart sounds: Normal heart sounds.  Pulmonary:     Effort: Pulmonary effort is normal.     Breath sounds: Normal breath sounds.  Abdominal:     General: Bowel sounds are normal.  Musculoskeletal: Normal range of motion.  Skin:    General: Skin is warm and dry.  Neurological:     Mental Status: He is alert and oriented to person, place, and time.  Psychiatric:        Mood and Affect: Mood normal.        Behavior: Behavior normal.        Thought Content: Thought content normal.        Judgment: Judgment normal.     Imaging: Ct Head Wo Contrast  Result Date: 02/19/2019 CLINICAL DATA:  Loss of consciousness after fall today. EXAM: CT HEAD WITHOUT CONTRAST TECHNIQUE: Contiguous axial images were obtained from the base of the skull through the vertex without intravenous contrast. COMPARISON:  None. FINDINGS: Brain: No evidence of acute infarction, hemorrhage, hydrocephalus, extra-axial collection or mass lesion/mass effect. Vascular: No hyperdense vessel or unexpected calcification. Skull: Normal. Negative for fracture or  focal lesion. Sinuses/Orbits: No acute finding. Other: None. IMPRESSION: Normal head CT. Electronically Signed   By: Marijo Conception M.D.   On: 02/19/2019 10:51   Nm Pet Image Initial (pi) Skull Base To Thigh  Result Date: 02/18/2019 CLINICAL DATA:  Subsequent treatment strategy for left base of tongue/left neck squamous cell cancer. EXAM: NUCLEAR MEDICINE PET SKULL BASE TO THIGH TECHNIQUE: 9.95 mCi F-18 FDG was injected intravenously. Full-ring PET imaging was performed from the skull base to thigh after the radiotracer. CT data was obtained and used for attenuation correction and anatomic localization. Fasting blood glucose: 94 mg/dl COMPARISON:  [facility] CT neck dated 01/08/2019 FINDINGS: Mediastinal blood pool activity: SUV max 2.5  NECK: 16 mm lesion at the left base of tongue (series 4/image 30), max SUV 24.9, likely corresponding to the patient's known primary head/neck malignancy. This previously measured 18 mm on CT. 5.3 x 3.7 cm left level 2/3 aggregate nodal mass, partially necrotic (series 4/image 32), max SUV 18.0. This previously measured 5.4 x 3.8 cm on CT. Additional bilateral cervical lymphadenopathy, measuring up to 12 mm short axis on the right (series 4/image 29) and 12 mm short on the left (series 4/image 38), better evaluated on CT. Max SUV 5.5 on the right, max SUV 7.3 on the left. Incidental CT findings: none CHEST: No suspicious pulmonary nodules. Mild linear scarring/atelectasis in the bilateral lower lobes. No hypermetabolic thoracic lymphadenopathy. Incidental CT findings: Mild coronary atherosclerosis in the left circumflex. ABDOMEN/PELVIS: 3.2 cm mass in segment 4A (series 4/image 106), max SUV 10.7, compatible with metastasis. No abnormal hypermetabolism in the spleen, pancreas, or adrenal glands. No hypermetabolic abdominopelvic lymphadenopathy. Incidental CT findings: Left renal sinus cysts. Atherosclerotic calcifications the abdominal aorta and branch vessels.  Thick-walled bladder, although underdistended. SKELETON: No focal hypermetabolic activity to suggest skeletal metastasis. Incidental CT findings: Degenerative changes of the visualized thoracolumbar spine. IMPRESSION: 16 mm lesion at the left base of tongue, corresponding to the patient's known primary head/neck malignancy, similar to prior CT. Bilateral cervical nodal metastases, as described above, including a dominant 5.3 cm left level 2/3 nodal mass, grossly unchanged. 3.2 cm hepatic metastasis in segment 4A. Electronically Signed   By: Julian Hy M.D.   On: 02/18/2019 14:13   Dg Knee Complete 4 Views Left  Result Date: 02/19/2019 CLINICAL DATA:  Acute left knee pain after fall today. EXAM: LEFT KNEE - COMPLETE 4+ VIEW COMPARISON:  None. FINDINGS: No evidence of fracture, dislocation, or joint effusion. Moderate narrowing of medial joint space is noted with osteophyte formation. Soft tissues are unremarkable. 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 No acute abnormality seen in the left knee. Electronically Signed   By: Marijo Conception M.D.   On: 02/19/2019 12:18    Labs:  CBC: Recent Labs    02/19/19 0903 03/02/19 0630  WBC 8.7 5.6  HGB 13.6 13.6  HCT 40.1 40.8  PLT 208 235    COAGS: Recent Labs    03/02/19 0630  INR 1.0    BMP: Recent Labs    02/19/19 0903  NA 136  K 4.0  CL 101  CO2 27  GLUCOSE 100*  BUN 17  CALCIUM 10.7*  CREATININE 1.19  GFRNONAA >60  GFRAA >60    LIVER FUNCTION TESTS: Recent Labs    02/19/19 0903  BILITOT 0.6  AST 18  ALT 20  ALKPHOS 67  PROT 8.1  ALBUMIN 4.9    TUMOR MARKERS: No results for input(s): AFPTM, CEA, CA199, CHROMGRNA in the last 8760 hours.  Assessment and Plan:  Newly diagnosed tongue cancer PET + liver lesion and lymphadenopathy Scheduled for liver lesion biopsy Risks and benefits of liver lesion biopsy was discussed with the patient and/or patient's family including, but not  limited to bleeding, infection, damage to adjacent structures or low yield requiring additional tests.  All of the questions were answered and there is agreement to proceed. Consent signed and in chart.  Thank you for this interesting consult.  I greatly enjoyed meeting [facility] and look forward to participating in their care.  A copy of this report was sent to the requesting provider on this date.  Electronically Signed: Lavonia Drafts,  PA-C 03/02/2019, 7:42 AM   I spent a total of  30 Minutes   in face to face in clinical consultation, greater than 50% of which was counseling/coordinating care for liver lesion biopsy

## 2019-03-02 NOTE — Procedures (Signed)
Interventional Radiology Procedure:   Indications: Squamous cell of head and neck and suspicious liver lesion.  Procedure: US guided liver lesion biopsy  Findings: Hyperechoic liver lesion.  Multiple cores taken due to scant specimens  Complications: None     EBL: Less than 20 ml  Plan: Bedrest 3 hours.     Patirica Longshore R. Anselm Pancoast, MD  Pager: [phone]

## 2019-03-04 ENCOUNTER — Other Ambulatory Visit: Payer: Self-pay | Admitting: Hematology

## 2019-03-04 DIAGNOSIS — C01 Malignant neoplasm of base of tongue: Secondary | ICD-10-CM

## 2019-03-04 NOTE — Progress Notes (Unsigned)
port 

## 2019-03-05 ENCOUNTER — Telehealth: Payer: Self-pay | Admitting: Hematology

## 2019-03-05 ENCOUNTER — Encounter: Payer: Self-pay | Admitting: *Deleted

## 2019-03-05 ENCOUNTER — Other Ambulatory Visit: Payer: Self-pay | Admitting: Hematology

## 2019-03-05 DIAGNOSIS — C01 Malignant neoplasm of base of tongue: Secondary | ICD-10-CM

## 2019-03-05 NOTE — Progress Notes (Signed)
Patient is scheduled to see Dr Maylon Peppers on Tuesday here in the [facility] office. He will discuss chemo and wanted a PORT scheduled for placement next week to begin treatment the week of June 1st.   Appointment for port made on 03/12/19. Patient is aware of this appointment. He is also aware of Tuesdays appointment and location.   This is a head and neck patient already navigated by Gayleen Orem. Will provide back up support as patient intends to received treatment here in the [facility] office.

## 2019-03-05 NOTE — Telephone Encounter (Signed)
Called and lmvm for patient with date/time per 5/21 staff message

## 2019-03-05 NOTE — Progress Notes (Signed)
[place] OFFICE PROGRESS NOTE  Patient Care Team: Patient, Jack Price as PCP - General (General Practice) Melissa Montane, MD as Consulting Physician (Otolaryngology) Eppie Gibson, MD as Attending Physician (Radiation Oncology) Tish Men, MD as Consulting Physician (Hematology) Leota Sauers, RN as Oncology Nurse Navigator  HEME/ONC OVERVIEW: 1. 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, p16+  -12/2018: CT neck showed a left BOT lesion extending to the left vallecula, large left Level II/III multilobulated necrotic nodal conglomerate up to 5.3cm, resulting in encasement and displacement of the left carotid sheath, occlusion of the left IJ vein, and it is inseparable from multiple adjacent structures; suspicious for R cervical LN involvement  -01/2019: FNA of the left cervical LN showed poorly differentiated carcinoma, favoring squamous cell carcinoma -02/2019: PET showed FDG-avid left tongue lesion with advanced, bulky bilateral cervical LN disease, as well as a 3.2cm FDG-avid liver lesion, concerning for metastatic disease; liver bx showed met squamous cell carcinoma  -03/2019 - present: carboplatin/docetaxel with G-CSF support    TREATMENT REGIMEN:  03/16/2019 - present (tentatively): carboplatin/docetaxel with G-CSF support   ASSESSMENT & PLAN:   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 -I reviewed the pathology results in detail with the patient -Given the confirmed metastatic disease to the liver, this is consistent with Stage IV disease, and therefore the goal of treatment is for palliative intent only -I reviewed NCCN guidelines in detail with the patient -I have requested PD-L1 on the liver bx  -Given the bulky left cervical LN disease ocluding the left IJ vein and partially encasing/displacing the left carotid sheath, he would benefit from more rapid  response, which requires some form of chemotherapy backbone, with or without immunotherapy -We discussed extensively the results of the KEYNOTE-048, consistent of chemotherapy and immunotherapy, vs. other standard-of-care regimen, such as carboplatin/docetaxel -We discussed the results from KEYNOTE-048, including the findings that while the combination of immunotherapy and chemotherapy lead to a higher response rate and more rapid tumor reduction than immunotherapy, the combination regimen also results in shorter duration of response than immunotherapy alone -Furthermore, the KEYNOTE-048 regimen is more intensive in terms of treatment schedule, including a continuous infusion of 5-FU over 96 hours, as well as some of the known side effects from 5-FU, such as mucositis -Alternatively, carboplatin/docetaxel has been a well-established regimen for metastatic head and neck cancer, and we reviewed some of the side effects of this regimen, including though not limited to, fatigue, weight loss, dyspnea, cough, musculoskeletal pain, life threatening infections, risk of allergic reactions, need for transfusions of blood products, nausea, vomiting, change in bowel habits, loss of hair, admission to hospital for various reasons, and risks of death. -Long term side-effects are also discussed including risks of permanent damage to nerve function, hearing loss, chronic fatigue, kidney damage with possibility needing hemodialysis, and rare secondary malignancy including bone marrow disorders. -The patient is aware that the response rates discussed earlier is not guaranteed.   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 Isidore Price -I reviewed the results of the thyroid function study in detail with him -Patient would like to monitor his thyroid function for now, and if labs show worsening hypothyroidism, then he is willing to start Synthroid   Goals of car discussion -In light of metastatic disease, we discussed that the goal of treatment is for palliative intent, not curative -Patient expressed understanding, and agreed with the plan  Orders Placed This Encounter  Procedures  . DG SWALLOW FUNC SPEECH PATH    Standing Status:   Future    Standing Expiration Date:   05/08/2020    Order Specific Question:   Reason for Exam (SYMPTOM  OR DIAGNOSIS REQUIRED)    Answer:   Aspiration with liquids    Order Specific Question:   Where should this test be performed?    Answer:   [facility]  . CBC with Differential (Cancer Center Only)    Standing Status:   Standing    Number of Occurrences:   20    Standing Expiration Date:   03/08/2020  . CMP ([facility] only)    Standing Status:   Standing    Number of Occurrences:   20    Standing Expiration Date:   03/08/2020    All questions were answered. The patient knows to call the clinic with any problems, questions or concerns. Jack barriers to learning was detected.  A total of more than 60 minutes were spent face-to-face with the patient during this encounter and over half of that time was spent on counseling and coordination of care as outlined above.   Return on 03/16/2019 for chemo education and 1st dose of  carbo/Taxotere, with Neulasta on 03/18/2019.   Return on 04/06/2019 for labs, port flush, clinic appt and 2nd Cycle of carbo/Taxotere.   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  Patient reports that since last visit, he still has intermittent nausea, usually triggered when he sleeps on the left side and puts pressure on the left side of his neck, where the tumor is located.  He denies any recurrent falls or syncope.  He also reports that he has to drink water slowly in small quantity, as it will cause some coughing if he tries to drink large quantity of fluids at once.  He has some hoarseness of voice, which is overall stable.  He denies any other complaint today.  SUMMARY OF ONCOLOGIC HISTORY:   Primary squamous cell carcinoma of base of tongue ([place])   01/08/2019 Imaging    CT neck: Findings are highly concerning for left tongue base malignancy with the largest left level II/III metastatic nodal conglomerate measuring up to 5.3 cm in greatest axial dimension resulting in encasement and displacement of the left carotid sheath, occlusion of the left internal jugular vein, and is inseparable from multiple adjacent structures as detailed in the body of the report.  Findings are concerning for additional right level IIA, right level II/III, left level III, right level IV, and possibly subcentimeter left level V nodal metastases as detailed above. PET/CT could further evaluate  the extent of metastatic disease if clinically warranted.    01/21/2019 Pathology Results    Final Cytologic Interpretation  Left neck, Fine Needle Aspiration I (smears and cell block): Porrly differentiatd carcinoma, favor squamous cell carcinoma. See comment.    02/18/2019 Imaging    PET: IMPRESSION: 16 mm lesion at the left base of tongue, corresponding  to the patient's known primary head/neck malignancy, similar to prior CT.  Bilateral cervical nodal metastases, as described above, including a dominant 5.3 cm left level 2/3 nodal mass, grossly unchanged.  3.2 cm hepatic metastasis in segment 4A.    03/02/2019 Pathology Results    US-guided liver bx: Liver, needle/core biopsy, Segment 4 A - METASTATIC SQUAMOUS CELL CARCINOMA - SEE COMMENT    03/16/2019 -  Chemotherapy    The patient had palonosetron (ALOXI) injection 0.25 mg, 0.25 mg, Intravenous,  Once, 0 of 6 cycles pegfilgrastim-cbqv (UDENYCA) injection 6 mg, 6 mg, Subcutaneous, Once, 0 of 6 cycles CARBOplatin (PARAPLATIN) 650 mg in sodium chloride 0.9 % 250 mL chemo infusion, 650 mg (100 % of original dose 647.4 mg), Intravenous,  Once, 0 of 6 cycles Dose modification:   (original dose 647.4 mg, Cycle 1) DOCEtaxel (TAXOTERE) 160 mg in sodium chloride 0.9 % 250 mL chemo infusion, 75 mg/m2 = 160 mg, Intravenous,  Once, 0 of 6 cycles fosaprepitant (EMEND) 150 mg, dexamethasone (DECADRON) 12 mg in sodium chloride 0.9 % 145 mL IVPB, , Intravenous,  Once, 0 of 6 cycles  for chemotherapy treatment.      REVIEW OF SYSTEMS:   Constitutional: ( - ) fevers, ( - )  chills , ( - ) night sweats Eyes: ( - ) blurriness of vision, ( - ) double vision, ( - ) watery eyes Ears, nose, mouth, throat, and face: ( - ) mucositis, ( - ) sore throat Respiratory: ( - ) cough, ( - ) dyspnea, ( - ) wheezes Cardiovascular: ( - ) palpitation, ( - ) chest discomfort, ( - ) lower extremity swelling Gastrointestinal:  ( - ) nausea, ( - ) heartburn, ( - ) change in bowel habits Skin: ( - ) abnormal skin rashes Lymphatics: ( - ) new lymphadenopathy, ( - ) easy bruising Neurological: ( - ) numbness, ( - ) tingling, ( - ) new weaknesses Behavioral/Psych: ( - ) mood change, ( - ) new changes  All other systems were reviewed with the patient and are negative.  I have reviewed the past medical history, past surgical  history, social history and family history with the patient and they are unchanged from previous note.  ALLERGIES:  is allergic to iodinated diagnostic agents.  MEDICATIONS:  Current Outpatient Medications  Medication Sig Dispense Refill  . acetaminophen (TYLENOL) 325 MG tablet Take 650 mg by mouth every 4 (four) hours as needed for mild pain.    Marland Kitchen ibuprofen (ADVIL) 200 MG tablet Take 200 mg by mouth every 6 (six) hours as needed for moderate pain.    . pseudoephedrine (SUDAFED) 60 MG tablet Take 60 mg by mouth every 4 (four) hours as needed for congestion.    Marland Kitchen dexamethasone (DECADRON) 4 MG tablet Take 2 tablets (8 mg total) by mouth 2 (two) times daily. Start the day before Taxotere. Then again the day after chemo for 3 days. 30 tablet 1  . lidocaine-prilocaine (EMLA) cream Apply to affected area once 30 g 3  . LORazepam (ATIVAN) 0.5 MG tablet Take 1 tablet (0.5 mg total) by mouth every 6 (six) hours  as needed (Nausea or vomiting). 30 tablet 0  . ondansetron (ZOFRAN) 8 MG tablet Take 1 tablet (8 mg total) by mouth 2 (two) times daily as needed for refractory nausea / vomiting. Start on day 3 after chemo. 30 tablet 1  . prochlorperazine (COMPAZINE) 10 MG tablet Take 1 tablet (10 mg total) by mouth every 6 (six) hours as needed (Nausea or vomiting). 30 tablet 1   Jack current facility-administered medications for this visit.     PHYSICAL EXAMINATION: ECOG PERFORMANCE STATUS: 1 - Symptomatic but completely ambulatory  Today's Vitals   03/09/19 0907 03/09/19 0908  BP: (!) 159/87   Pulse: 80   Resp: 17   SpO2: 99%   Weight: 200 lb (90.7 kg)   Height: 5' 11" (1.803 m)   PainSc: 0-Jack pain 0-Jack pain   Body mass index is 27.89 kg/m.  Filed Weights   03/09/19 0907  Weight: 200 lb (90.7 kg)    GENERAL: alert, Jack distress and comfortable SKIN: skin color, texture, turgor are normal, Jack rashes or significant lesions EYES: conjunctiva are pink and non-injected, sclera clear OROPHARYNX: Jack  exudate, Jack erythema; lips, buccal mucosa, and tongue normal  NECK: supple, non-tender LYMPH: bulky left cervical adenopathy LUNGS: clear to auscultation with normal breathing effort HEART: regular rate & rhythm and Jack murmurs and Jack lower extremity edema ABDOMEN: soft, non-tender, non-distended, normal bowel sounds Musculoskeletal: Jack cyanosis of digits and Jack clubbing  PSYCH: alert & oriented x 3, fluent speech NEURO: Jack focal motor/sensory deficits  LABORATORY DATA:  I have reviewed the data as listed    Component Value Date/Time   NA 139 03/09/2019 0847   K 3.9 03/09/2019 0847   CL 103 03/09/2019 0847   CO2 26 03/09/2019 0847   GLUCOSE 124 (H) 03/09/2019 0847   BUN 15 03/09/2019 0847   CREATININE 1.17 03/09/2019 0847   CALCIUM 10.0 03/09/2019 0847   PROT 7.3 03/09/2019 0847   ALBUMIN 4.8 03/09/2019 0847   AST 14 (L) 03/09/2019 0847   ALT 13 03/09/2019 0847   ALKPHOS 63 03/09/2019 0847   BILITOT 0.5 03/09/2019 0847   GFRNONAA >60 03/09/2019 0847   GFRAA >60 03/09/2019 0847    Jack results found for: SPEP, UPEP  Lab Results  Component Value Date   WBC 6.0 03/09/2019   NEUTROABS 4.7 03/09/2019   HGB 12.9 (L) 03/09/2019   HCT 39.0 03/09/2019   MCV 96.3 03/09/2019   PLT 231 03/09/2019      Chemistry      Component Value Date/Time   NA 139 03/09/2019 0847   K 3.9 03/09/2019 0847   CL 103 03/09/2019 0847   CO2 26 03/09/2019 0847   BUN 15 03/09/2019 0847   CREATININE 1.17 03/09/2019 0847      Component Value Date/Time   CALCIUM 10.0 03/09/2019 0847   ALKPHOS 63 03/09/2019 0847   AST 14 (L) 03/09/2019 0847   ALT 13 03/09/2019 0847   BILITOT 0.5 03/09/2019 0847       RADIOGRAPHIC STUDIES: I have personally reviewed the radiological images as listed below and agreed with the findings in the report. Ct Head Wo Contrast  Result Date: 02/19/2019 CLINICAL DATA:  Loss of consciousness after fall today. EXAM: CT HEAD WITHOUT CONTRAST TECHNIQUE: Contiguous axial  images were obtained from the base of the skull through the vertex without intravenous contrast. COMPARISON:  None. FINDINGS: Brain: Jack evidence of acute infarction, hemorrhage, hydrocephalus, extra-axial collection or mass lesion/mass effect. Vascular: Jack hyperdense  vessel or unexpected calcification. Skull: Normal. Negative for fracture or focal lesion. Sinuses/Orbits: Jack acute finding. Other: None. IMPRESSION: Normal head CT. Electronically Signed   By: Marijo Conception M.D.   On: 02/19/2019 10:51   Nm Pet Image Initial (pi) Skull Base To Thigh  Result Date: 02/18/2019 CLINICAL DATA:  Subsequent treatment strategy for left base of tongue/left neck squamous cell cancer. EXAM: NUCLEAR MEDICINE PET SKULL BASE TO THIGH TECHNIQUE: 9.95 mCi F-18 FDG was injected intravenously. Full-ring PET imaging was performed from the skull base to thigh after the radiotracer. CT data was obtained and used for attenuation correction and anatomic localization. Fasting blood glucose: 94 mg/dl COMPARISON:  [facility] CT neck dated 01/08/2019 FINDINGS: Mediastinal blood pool activity: SUV max 2.5 NECK: 16 mm lesion at the left base of tongue (series 4/image 30), max SUV 24.9, likely corresponding to the patient's known primary head/neck malignancy. This previously measured 18 mm on CT. 5.3 x 3.7 cm left level 2/3 aggregate nodal mass, partially necrotic (series 4/image 32), max SUV 18.0. This previously measured 5.4 x 3.8 cm on CT. Additional bilateral cervical lymphadenopathy, measuring up to 12 mm short axis on the right (series 4/image 29) and 12 mm short on the left (series 4/image 38), better evaluated on CT. Max SUV 5.5 on the right, max SUV 7.3 on the left. Incidental CT findings: none CHEST: Jack suspicious pulmonary nodules. Mild linear scarring/atelectasis in the bilateral lower lobes. Jack hypermetabolic thoracic lymphadenopathy. Incidental CT findings: Mild coronary atherosclerosis in the left circumflex.  ABDOMEN/PELVIS: 3.2 cm mass in segment 4A (series 4/image 106), max SUV 10.7, compatible with metastasis. Jack abnormal hypermetabolism in the spleen, pancreas, or adrenal glands. Jack hypermetabolic abdominopelvic lymphadenopathy. Incidental CT findings: Left renal sinus cysts. Atherosclerotic calcifications the abdominal aorta and branch vessels. Thick-walled bladder, although underdistended. SKELETON: Jack focal hypermetabolic activity to suggest skeletal metastasis. Incidental CT findings: Degenerative changes of the visualized thoracolumbar spine. IMPRESSION: 16 mm lesion at the left base of tongue, corresponding to the patient's known primary head/neck malignancy, similar to prior CT. Bilateral cervical nodal metastases, as described above, including a dominant 5.3 cm left level 2/3 nodal mass, grossly unchanged. 3.2 cm hepatic metastasis in segment 4A. Electronically Signed   By: Julian Hy M.D.   On: 02/18/2019 14:13   US Biopsy (liver)  Result Date: 03/02/2019 INDICATION: 64 year old with head and neck squamous cell carcinoma. Suspicious liver lesion on PET-CT. Liver lesion is indeterminate and needs tissue diagnosis. EXAM: ULTRASOUND-GUIDED LIVER LESION BIOPSY MEDICATIONS: None. ANESTHESIA/SEDATION: Moderate (conscious) sedation was employed during this procedure. A total of Versed 2.0 mg and Fentanyl 150 mcg was administered intravenously. Moderate Sedation Time: 25 minutes. The patient's level of consciousness and vital signs were monitored continuously by radiology nursing throughout the procedure under my direct supervision. FLUOROSCOPY TIME:  Fluoroscopy Time: None COMPLICATIONS: None immediate. PROCEDURE: Informed written consent was obtained from the patient after a thorough discussion of the procedural risks, benefits and alternatives. All questions were addressed. Maximal barrier sterile technique was utilized including caps, mask, sterile gowns, sterile gloves, sterile drape, hand hygiene  and skin antiseptic. A timeout was performed prior to the initiation of the procedure. Slightly hyperechoic lesion was identified in the medial left hepatic lobe. Right intercostal area was prepped with chlorhexidine and sterile field was created. Skin and soft tissues were anesthetized with 1% lidocaine. Using ultrasound guidance, 17 gauge coaxial needle was directed into the lesion. Numerous core biopsies were obtained with 18 gauge device. Some  of the specimens also contained adjacent parenchyma in order to get larger core samples. Initially, small amount of cystic fluid was draining through the 17 coaxial needle. Blood and cystic fluid was also collected for cytology. 17 gauge needle was removed without complication. Bandage placed over the puncture site. FINDINGS: Heterogeneous and slightly hyperechoic lesion in the medial left hepatic lobe. Small cystic component within the lesion. The cystic portion decompressed following the first core biopsy and a small amount of fluid was sent for cytology. Multiple core biopsies were obtained within the lesion. Many of the core biopsies were scant. Therefore, larger core biopsies were obtained with adjacent liver parenchyma. Jack significant bleeding or hematoma formation following the core biopsies. IMPRESSION: Ultrasound-guided biopsy of the liver lesion. In addition to core specimens, small amount of bloody fluid was sent for cytology. Electronically Signed   By: Markus Daft M.D.   On: 03/02/2019 12:56   Dg Knee Complete 4 Views Left  Result Date: 02/19/2019 CLINICAL DATA:  Acute left knee pain after fall today. EXAM: LEFT KNEE - COMPLETE 4+ VIEW COMPARISON:  None. FINDINGS: Jack evidence of fracture, dislocation, or joint effusion. Moderate narrowing of medial joint space is noted with osteophyte formation. Soft tissues are unremarkable. 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 Jack acute abnormality seen in the left knee. Electronically Signed    By: Marijo Conception M.D.   On: 02/19/2019 12:18

## 2019-03-09 ENCOUNTER — Other Ambulatory Visit: Payer: Self-pay

## 2019-03-09 ENCOUNTER — Encounter: Payer: Self-pay | Admitting: Hematology

## 2019-03-09 ENCOUNTER — Inpatient Hospital Stay: Payer: BLUE CROSS/BLUE SHIELD

## 2019-03-09 ENCOUNTER — Inpatient Hospital Stay (HOSPITAL_BASED_OUTPATIENT_CLINIC_OR_DEPARTMENT_OTHER): Payer: BLUE CROSS/BLUE SHIELD | Admitting: Hematology

## 2019-03-09 VITALS — BP 159/87 | HR 80 | Resp 17 | Ht 71.0 in | Wt 200.0 lb

## 2019-03-09 DIAGNOSIS — T17908A Unspecified foreign body in respiratory tract, part unspecified causing other injury, initial encounter: Secondary | ICD-10-CM

## 2019-03-09 DIAGNOSIS — R05 Cough: Secondary | ICD-10-CM

## 2019-03-09 DIAGNOSIS — C01 Malignant neoplasm of base of tongue: Secondary | ICD-10-CM | POA: Diagnosis not present

## 2019-03-09 DIAGNOSIS — G902 Horner's syndrome: Secondary | ICD-10-CM

## 2019-03-09 DIAGNOSIS — Z7189 Other specified counseling: Secondary | ICD-10-CM

## 2019-03-09 DIAGNOSIS — C787 Secondary malignant neoplasm of liver and intrahepatic bile duct: Secondary | ICD-10-CM

## 2019-03-09 DIAGNOSIS — C770 Secondary and unspecified malignant neoplasm of lymph nodes of head, face and neck: Secondary | ICD-10-CM | POA: Diagnosis not present

## 2019-03-09 DIAGNOSIS — E039 Hypothyroidism, unspecified: Secondary | ICD-10-CM

## 2019-03-09 DIAGNOSIS — Z79899 Other long term (current) drug therapy: Secondary | ICD-10-CM

## 2019-03-09 LAB — CMP (CANCER CENTER ONLY)
ALT: 13 U/L (ref 0–44)
AST: 14 U/L — ABNORMAL LOW (ref 15–41)
Albumin: 4.8 g/dL (ref 3.5–5.0)
Alkaline Phosphatase: 63 U/L (ref 38–126)
Anion gap: 10 (ref 5–15)
BUN: 15 mg/dL (ref 8–23)
CO2: 26 mmol/L (ref 22–32)
Calcium: 10 mg/dL (ref 8.9–10.3)
Chloride: 103 mmol/L (ref 98–111)
Creatinine: 1.17 mg/dL (ref 0.61–1.24)
GFR, Est AFR Am: >60 mL/min (ref >60)
GFR, Estimated: >60 mL/min (ref >60)
Glucose, Bld: 124 mg/dL — ABNORMAL HIGH (ref 70–99)
Potassium: 3.9 mmol/L (ref 3.5–5.1)
Sodium: 139 mmol/L (ref 135–145)
Total Bilirubin: 0.5 mg/dL (ref 0.3–1.2)
Total Protein: 7.3 g/dL (ref 6.5–8.1)

## 2019-03-09 LAB — CBC WITH DIFFERENTIAL (CANCER CENTER ONLY)
Abs Immature Granulocytes: 0.01 10*3/uL (ref 0.00–0.07)
Basophils Absolute: 0 10*3/uL (ref 0.0–0.1)
Basophils Relative: 0 %
Eosinophils Absolute: 0.1 10*3/uL (ref 0.0–0.5)
Eosinophils Relative: 2 %
HCT: 39 % (ref 39.0–52.0)
Hemoglobin: 12.9 g/dL — ABNORMAL LOW (ref 13.0–17.0)
Immature Granulocytes: 0 %
Lymphocytes Relative: 11 %
Lymphs Abs: 0.7 10*3/uL (ref 0.7–4.0)
MCH: 31.9 pg (ref 26.0–34.0)
MCHC: 33.1 g/dL (ref 30.0–36.0)
MCV: 96.3 fL (ref 80.0–100.0)
Monocytes Absolute: 0.5 10*3/uL (ref 0.1–1.0)
Monocytes Relative: 8 %
Neutro Abs: 4.7 10*3/uL (ref 1.7–7.7)
Neutrophils Relative %: 79 %
Platelet Count: 231 10*3/uL (ref 150–400)
RBC: 4.05 MIL/uL — ABNORMAL LOW (ref 4.22–5.81)
RDW: 11.8 % (ref 11.5–15.5)
WBC Count: 6 10*3/uL (ref 4.0–10.5)
nRBC: 0 % (ref 0.0–0.2)

## 2019-03-09 MED ORDER — ONDANSETRON HCL 8 MG PO TABS: 8.0000 mg | ORAL_TABLET | Freq: Two times a day (BID) | ORAL | 1 refills | Status: DC | PRN

## 2019-03-09 MED ORDER — LORAZEPAM 0.5 MG PO TABS: 0.5000 mg | ORAL_TABLET | Freq: Four times a day (QID) | ORAL | 0 refills | Status: DC | PRN

## 2019-03-09 MED ORDER — LIDOCAINE-PRILOCAINE 2.5-2.5 % EX CREA: TOPICAL_CREAM | CUTANEOUS | 3 refills | Status: AC

## 2019-03-09 MED ORDER — DEXAMETHASONE 4 MG PO TABS: 8.0000 mg | ORAL_TABLET | Freq: Two times a day (BID) | ORAL | 1 refills | Status: AC

## 2019-03-09 MED ORDER — PROCHLORPERAZINE MALEATE 10 MG PO TABS: 10.0000 mg | ORAL_TABLET | Freq: Four times a day (QID) | ORAL | 1 refills | Status: AC | PRN

## 2019-03-09 NOTE — Progress Notes (Signed)
START OFF PATHWAY REGIMEN - Head and Neck   [id number]:Carboplatin + Docetaxel (5/75) every 21 days:   A cycle is every 21 days:     Docetaxel      Carboplatin   **Always confirm dose/schedule in your pharmacy ordering system**  Patient Characteristics: Oropharynx, HPV Positive, Metastatic, First Line, No Prior Platinum-Based Chemoradiation within 6 Months, PD-L1 Expression Negative (CPS < 1) / Unknown Disease Classification: Oropharynx HPV Status: Positive (+) AJCC N Category: cN2 AJCC 8 Stage Grouping: IV Current Disease Status: Metastatic Disease AJCC T Category: T3 AJCC M Category: M1 Line of Therapy: First Line Prior Platinum Status: No Prior Platinum-Based Chemoradiation within 6 Months PD-L1 Expression Status: Awaiting Test Results Intent of Therapy: Non-Curative / Palliative Intent, Discussed with Patient

## 2019-03-10 ENCOUNTER — Telehealth: Payer: Self-pay | Admitting: Hematology

## 2019-03-10 NOTE — Telephone Encounter (Signed)
LMVM for patient to return my call regarding appts for 6/23

## 2019-03-11 ENCOUNTER — Other Ambulatory Visit: Payer: Self-pay | Admitting: Radiology

## 2019-03-12 ENCOUNTER — Ambulatory Visit (HOSPITAL_COMMUNITY)
Admission: RE | Admit: 2019-03-12 | Discharge: 2019-03-12 | Disposition: A | Discharge status: Home / Self Care | Payer: BLUE CROSS/BLUE SHIELD | Source: Ambulatory Visit | Attending: Hematology | Admitting: Hematology

## 2019-03-12 ENCOUNTER — Other Ambulatory Visit: Payer: Self-pay

## 2019-03-12 ENCOUNTER — Encounter (HOSPITAL_COMMUNITY): Payer: Self-pay

## 2019-03-12 DIAGNOSIS — C01 Malignant neoplasm of base of tongue: Secondary | ICD-10-CM | POA: Diagnosis not present

## 2019-03-12 DIAGNOSIS — C787 Secondary malignant neoplasm of liver and intrahepatic bile duct: Secondary | ICD-10-CM | POA: Diagnosis not present

## 2019-03-12 HISTORY — PX: IR IMAGING GUIDED PORT INSERTION: IMG5740

## 2019-03-12 LAB — CBC WITH DIFFERENTIAL/PLATELET
Abs Immature Granulocytes: 0.03 10*3/uL (ref 0.00–0.07)
Basophils Absolute: 0 10*3/uL (ref 0.0–0.1)
Basophils Relative: 0 %
Eosinophils Absolute: 0.1 10*3/uL (ref 0.0–0.5)
Eosinophils Relative: 2 %
HCT: 40.2 % (ref 39.0–52.0)
Hemoglobin: 13.6 g/dL (ref 13.0–17.0)
Immature Granulocytes: 1 %
Lymphocytes Relative: 12 %
Lymphs Abs: 0.8 10*3/uL (ref 0.7–4.0)
MCH: 32.7 pg (ref 26.0–34.0)
MCHC: 33.8 g/dL (ref 30.0–36.0)
MCV: 96.6 fL (ref 80.0–100.0)
Monocytes Absolute: 0.6 10*3/uL (ref 0.1–1.0)
Monocytes Relative: 10 %
Neutro Abs: 5 10*3/uL (ref 1.7–7.7)
Neutrophils Relative %: 75 %
Platelets: 239 10*3/uL (ref 150–400)
RBC: 4.16 MIL/uL — ABNORMAL LOW (ref 4.22–5.81)
RDW: 12 % (ref 11.5–15.5)
WBC: 6.6 10*3/uL (ref 4.0–10.5)
nRBC: 0 % (ref 0.0–0.2)

## 2019-03-12 MED ORDER — MIDAZOLAM HCL 2 MG/2ML IJ SOLN
INTRAMUSCULAR | Status: AC
  Filled 2019-03-12: qty 4

## 2019-03-12 MED ORDER — CEFAZOLIN SODIUM-DEXTROSE 2-4 GM/100ML-% IV SOLN
2.0000 g | INTRAVENOUS | Status: AC
  Administered 2019-03-12: 16:00:00 2 g via INTRAVENOUS

## 2019-03-12 MED ORDER — FENTANYL CITRATE (PF) 100 MCG/2ML IJ SOLN
INTRAMUSCULAR | Status: AC | PRN
  Administered 2019-03-12 (×2): 50 ug via INTRAVENOUS

## 2019-03-12 MED ORDER — HEPARIN SOD (PORK) LOCK FLUSH 100 UNIT/ML IV SOLN
INTRAVENOUS | Status: AC
  Filled 2019-03-12: qty 5

## 2019-03-12 MED ORDER — LIDOCAINE-EPINEPHRINE 1 %-1:100000 IJ SOLN
INTRAMUSCULAR | Status: AC | PRN
  Administered 2019-03-12: 10 mL

## 2019-03-12 MED ORDER — SODIUM CHLORIDE 0.9 % IV SOLN
INTRAVENOUS | Status: DC
  Administered 2019-03-12: 14:00:00 via INTRAVENOUS

## 2019-03-12 MED ORDER — FENTANYL CITRATE (PF) 100 MCG/2ML IJ SOLN
INTRAMUSCULAR | Status: AC
  Filled 2019-03-12: qty 2

## 2019-03-12 MED ORDER — LIDOCAINE-EPINEPHRINE 1 %-1:100000 IJ SOLN
INTRAMUSCULAR | Status: AC | PRN
  Administered 2019-03-12: 5 mL

## 2019-03-12 MED ORDER — MIDAZOLAM HCL 2 MG/2ML IJ SOLN
INTRAMUSCULAR | Status: AC | PRN
  Administered 2019-03-12 (×3): 1 mg via INTRAVENOUS

## 2019-03-12 MED ORDER — CEFAZOLIN SODIUM-DEXTROSE 2-4 GM/100ML-% IV SOLN
INTRAVENOUS | Status: AC
  Administered 2019-03-12: 2 g via INTRAVENOUS
  Filled 2019-03-12: qty 100

## 2019-03-12 MED ORDER — LIDOCAINE-EPINEPHRINE 1 %-1:100000 IJ SOLN
INTRAMUSCULAR | Status: AC
  Filled 2019-03-12: qty 1

## 2019-03-12 NOTE — Procedures (Signed)
Pre Procedure Dx: Poor venous access Post Procedural Dx: Same  Successful placement of right IJ approach port-a-cath with tip at the superior caval atrial junction. The catheter is ready for immediate use.  Estimated Blood Loss: Minimal  Complications: None immediate.  Jay Jem Castro, MD Pager #: [phone]   

## 2019-03-12 NOTE — Discharge Instructions (Signed)
Moderate Conscious Sedation, Adult, Care After These instructions provide you with information about caring for yourself after your procedure. Your health care provider may also give you more specific instructions. Your treatment has been planned according to current medical practices, but problems sometimes occur. Call your health care provider if you have any problems or questions after your procedure. What can I expect after the procedure? After your procedure, it is common:  To feel sleepy for several hours.  To feel clumsy and have poor balance for several hours.  To have poor judgment for several hours.  To vomit if you eat too soon. Follow these instructions at home: For at least 24 hours after the procedure:   Do not: ? Participate in activities where you could fall or become injured. ? Drive. ? Use heavy machinery. ? Drink alcohol. ? Take sleeping pills or medicines that cause drowsiness. ? Make important decisions or sign legal documents. ? Take care of children on your own.  Rest. Eating and drinking  Follow the diet recommended by your health care provider.  If you vomit: ? Drink water, juice, or soup when you can drink without vomiting. ? Make sure you have little or no nausea before eating solid foods. General instructions  Have a responsible adult stay with you until you are awake and alert.  Take over-the-counter and prescription medicines only as told by your health care provider.  If you smoke, do not smoke without supervision.  Keep all follow-up visits as told by your health care provider. This is important. Contact a health care provider if:  You keep feeling nauseous or you keep vomiting.  You feel light-headed.  You develop a rash.  You have a fever. Get help right away if:  You have trouble breathing. This information is not intended to replace advice given to you by your health care provider. Make sure you discuss any questions you have  with your health care provider. Document Released: 07/21/2013 Document Revised: 03/04/2016 Document Reviewed: 01/20/2016 Elsevier Interactive Patient Education  2019 [place] not use EMLA cream over the dermabond (skin glue) that covers your new port until the cancer center infusion nurses let you know the incision has healed. The EMLA cream will dissolve your skin glue and the incision will come apart and get infected.    Implanted [facility] Guide An implanted port is a device that is placed under the skin. It is usually placed in the chest. The device can be used to give IV medicine, to take blood, or for dialysis. You may have an implanted port if:  You need IV medicine that would be irritating to the small veins in your hands or arms.  You need IV medicines, such as antibiotics, for a long period of time.  You need IV nutrition for a long period of time.  You need dialysis. Having a port means that your health care provider will not need to use the veins in your arms for these procedures. You may have fewer limitations when using a port than you would if you used other types of long-term IVs, and you will likely be able to return to normal activities after your incision heals. An implanted port has two main parts:  Reservoir. The reservoir is the part where a needle is inserted to give medicines or draw blood. The reservoir is round. After it is placed, it appears as a small, raised area under your skin.  Catheter. The catheter is a thin, flexible  tube that connects the reservoir to a vein. Medicine that is inserted into the reservoir goes into the catheter and then into the vein. How is my port accessed? To access your port:  A numbing cream may be placed on the skin over the port site.  Your health care provider will put on a mask and sterile gloves.  The skin over your port will be cleaned carefully with a germ-killing soap and allowed to dry.  Your health care  provider will gently pinch the port and insert a needle into it.  Your health care provider will check for a blood return to make sure the port is in the vein and is not clogged.  If your port needs to remain accessed to get medicine continuously (constant infusion), your health care provider will place a clear bandage (dressing) over the needle site. The dressing and needle will need to be changed every week, or as told by your health care provider. What is flushing? Flushing helps keep the port from getting clogged. Follow instructions from your health care provider about how and when to flush the port. Ports are usually flushed with saline solution or a medicine called heparin. The need for flushing will depend on how the port is used:  If the port is only used from time to time to give medicines or draw blood, the port may need to be flushed: ? Before and after medicines have been given. ? Before and after blood has been drawn. ? As part of routine maintenance. Flushing may be recommended every 4-6 weeks.  If a constant infusion is running, the port may not need to be flushed.  Throw away any syringes in a disposal container that is meant for sharp items (sharps container). You can buy a sharps container from a pharmacy, or you can make one by using an empty hard plastic bottle with a cover. How long will my port stay implanted? The port can stay in for as long as your health care provider thinks it is needed. When it is time for the port to come out, a surgery will be done to remove it. The surgery will be similar to the procedure that was done to put the port in. Follow these instructions at home:   Flush your port as told by your health care provider.  If you need an infusion over several days, follow instructions from your health care provider about how to take care of your port site. Make sure you: ? Wash your hands with soap and water before you change your dressing. If soap and  water are not available, use alcohol-based hand sanitizer. ? Change your dressing as told by your health care provider.  You may remove your dressing tomorrow. ? Place any used dressings or infusion bags into a plastic bag. Throw that bag in the trash. ? Keep the dressing that covers the needle clean and dry. Do not get it wet. ? Do not use scissors or sharp objects near the tube. ? Keep the tube clamped, unless it is being used.  Check your port site every day for signs of infection. Check for: ? Redness, swelling, or pain. ? Fluid or blood. ? Pus or a bad smell.  Protect the skin around the port site. ? Avoid wearing bra straps that rub or irritate the site. ? Protect the skin around your port from seat belts. Place a soft pad over your chest if needed.  Bathe or shower as told by your  health care provider. The site may get wet as long as you are not actively receiving an infusion.  You may shower tomorrow.  Return to your normal activities as told by your health care provider. Ask your health care provider what activities are safe for you.  Carry a medical alert card or wear a medical alert bracelet at all times. This will let health care providers know that you have an implanted port in case of an emergency. Get help right away if:  You have redness, swelling, or pain at the port site.  You have fluid or blood coming from your port site.  You have pus or a bad smell coming from the port site.  You have a fever. Summary  Implanted ports are usually placed in the chest for long-term IV access.  Follow instructions from your health care provider about flushing the port and changing bandages (dressings).  Take care of the area around your port by avoiding clothing that puts pressure on the area, and by watching for signs of infection.  Protect the skin around your port from seat belts. Place a soft pad over your chest if needed.  Get help right away if you have a fever or you  have redness, swelling, pain, drainage, or a bad smell at the port site. This information is not intended to replace advice given to you by your health care provider. Make sure you discuss any questions you have with your health care provider. Document Released: 09/30/2005 Document Revised: 11/02/2016 Document Reviewed: 11/02/2016 Elsevier Interactive Patient Education  2019 [place].    Moderate Conscious Sedation, Adult, Care After These instructions provide you with information about caring for yourself after your procedure. Your health care provider may also give you more specific instructions. Your treatment has been planned according to current medical practices, but problems sometimes occur. Call your health care provider if you have any problems or questions after your procedure. What can I expect after the procedure? After your procedure, it is common:  To feel sleepy for several hours.  To feel clumsy and have poor balance for several hours.  To have poor judgment for several hours.  To vomit if you eat too soon. Follow these instructions at home: For at least 24 hours after the procedure:   Do not: ? Participate in activities where you could fall or become injured. ? Drive. ? Use heavy machinery. ? Drink alcohol. ? Take sleeping pills or medicines that cause drowsiness. ? Make important decisions or sign legal documents. ? Take care of children on your own.  Rest. Eating and drinking  Follow the diet recommended by your health care provider.  If you vomit: ? Drink water, juice, or soup when you can drink without vomiting. ? Make sure you have little or no nausea before eating solid foods. General instructions  Have a responsible adult stay with you until you are awake and alert.  Take over-the-counter and prescription medicines only as told by your health care provider.  If you smoke, do not smoke without supervision.  Keep all follow-up visits as told  by your health care provider. This is important. Contact a health care provider if:  You keep feeling nauseous or you keep vomiting.  You feel light-headed.  You develop a rash.  You have a fever. Get help right away if:  You have trouble breathing. This information is not intended to replace advice given to you by your health care provider. Make sure you discuss  any questions you have with your health care provider. Document Released: 07/21/2013 Document Revised: 03/04/2016 Document Reviewed: 01/20/2016 Elsevier Interactive Patient Education  2019 [employer].

## 2019-03-12 NOTE — H&P (Signed)
Referring Physician(s): Zhao,Yan  Supervising Physician: Sandi Mariscal  Patient Status:  [facility] OP  Chief Complaint:  "I'm getting a port a cath"  Subjective: Patient familiar to IR service from liver lesion biopsy on 03/02/2019.  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  He currently denies fever, headache, chest pain, dyspnea, cough, abdominal/back pain, nausea, vomiting or bleeding.  Past Medical History:  Diagnosis Date  . Cancer ([place])   . 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    Allergies: Iodinated diagnostic agents  Medications: Prior to Admission medications   Medication Sig Start Date End Date Taking? Authorizing Provider  acetaminophen (TYLENOL) 325 MG tablet Take 650 mg by mouth every 4 (four) hours as needed for mild pain. 02/19/19   [provider]  dexamethasone (DECADRON) 4 MG tablet Take 2 tablets (8 mg total) by mouth 2 (two) times daily. Start the day before Taxotere. Then again the day after chemo for 3 days. 03/09/19   Tish Men, MD  ibuprofen (ADVIL) 200 MG tablet Take 200 mg by mouth every 6 (six) hours as needed for moderate pain.    [provider]  lidocaine-prilocaine (EMLA) cream Apply to affected area once 03/09/19   Tish Men, MD  LORazepam (ATIVAN) 0.5 MG tablet Take 1 tablet (0.5 mg total) by mouth every 6 (six) hours as needed (Nausea or vomiting). 03/09/19   Tish Men, MD  ondansetron (ZOFRAN) 8 MG tablet Take 1 tablet (8 mg total) by mouth 2 (two) times daily as needed for refractory nausea / vomiting. Start on day 3 after chemo. 03/09/19   Tish Men, MD  prochlorperazine (COMPAZINE) 10 MG tablet Take 1 tablet (10 mg total) by mouth every 6 (six) hours as needed (Nausea or vomiting). 03/09/19   Tish Men, MD   pseudoephedrine (SUDAFED) 60 MG tablet Take 60 mg by mouth every 4 (four) hours as needed for congestion.    [provider]     Vital Signs: Blood pressure 156/90, heart rate 80, temp 98.8, respirations 18, O2 sats 99% room air   Physical Exam awake, alert.  Chest clear to auscultation bilaterally.  Heart with regular rate and rhythm.  Abdomen soft, positive bowel sounds, nontender.  No lower extremity edema.  Imaging: No results found.  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 Risks and benefits of image guided port-a-catheter placement was discussed with the patient including, but not limited to bleeding, infection, pneumothorax, or fibrin sheath development and need for additional procedures.  All of the patient's questions were answered, patient is agreeable to proceed. Consent signed and in chart.  LABS PEND   Electronically Signed: D. Rowe Robert, PA-C 03/12/2019, 12:59 PM   I spent a total of 20 minutes at the the patient's bedside AND on the patient's hospital floor or unit, greater than 50% of which was counseling/coordinating care for Port-A-Cath placement

## 2019-03-16 ENCOUNTER — Inpatient Hospital Stay: Payer: BLUE CROSS/BLUE SHIELD

## 2019-03-16 ENCOUNTER — Other Ambulatory Visit: Payer: Self-pay | Admitting: Family

## 2019-03-16 ENCOUNTER — Inpatient Hospital Stay: Payer: BLUE CROSS/BLUE SHIELD | Attending: Hematology

## 2019-03-16 ENCOUNTER — Encounter: Payer: Self-pay | Admitting: *Deleted

## 2019-03-16 ENCOUNTER — Ambulatory Visit (HOSPITAL_COMMUNITY): Payer: Self-pay

## 2019-03-16 ENCOUNTER — Other Ambulatory Visit: Payer: Self-pay

## 2019-03-16 VITALS — BP 141/90 | HR 70 | Temp 98.7°F | Resp 16

## 2019-03-16 DIAGNOSIS — G902 Horner's syndrome: Secondary | ICD-10-CM | POA: Insufficient documentation

## 2019-03-16 DIAGNOSIS — C01 Malignant neoplasm of base of tongue: Secondary | ICD-10-CM

## 2019-03-16 DIAGNOSIS — Z5111 Encounter for antineoplastic chemotherapy: Secondary | ICD-10-CM | POA: Insufficient documentation

## 2019-03-16 DIAGNOSIS — D701 Agranulocytosis secondary to cancer chemotherapy: Secondary | ICD-10-CM | POA: Insufficient documentation

## 2019-03-16 DIAGNOSIS — T451X5S Adverse effect of antineoplastic and immunosuppressive drugs, sequela: Secondary | ICD-10-CM | POA: Diagnosis not present

## 2019-03-16 LAB — CBC WITH DIFFERENTIAL (CANCER CENTER ONLY)
Abs Immature Granulocytes: 0.01 10*3/uL (ref 0.00–0.07)
Basophils Absolute: 0 10*3/uL (ref 0.0–0.1)
Basophils Relative: 0 %
Eosinophils Absolute: 0.1 10*3/uL (ref 0.0–0.5)
Eosinophils Relative: 2 %
HCT: 36.3 % — ABNORMAL LOW (ref 39.0–52.0)
Hemoglobin: 12.4 g/dL — ABNORMAL LOW (ref 13.0–17.0)
Immature Granulocytes: 0 %
Lymphocytes Relative: 7 %
Lymphs Abs: 0.3 10*3/uL — ABNORMAL LOW (ref 0.7–4.0)
MCH: 32.7 pg (ref 26.0–34.0)
MCHC: 34.2 g/dL (ref 30.0–36.0)
MCV: 95.8 fL (ref 80.0–100.0)
Monocytes Absolute: 0.3 10*3/uL (ref 0.1–1.0)
Monocytes Relative: 7 %
Neutro Abs: 4.1 10*3/uL (ref 1.7–7.7)
Neutrophils Relative %: 84 %
Platelet Count: 187 10*3/uL (ref 150–400)
RBC: 3.79 MIL/uL — ABNORMAL LOW (ref 4.22–5.81)
RDW: 11.9 % (ref 11.5–15.5)
WBC Count: 4.8 10*3/uL (ref 4.0–10.5)
nRBC: 0 % (ref 0.0–0.2)

## 2019-03-16 LAB — CMP (CANCER CENTER ONLY)
ALT: 63 U/L — ABNORMAL HIGH (ref 0–44)
AST: 64 U/L — ABNORMAL HIGH (ref 15–41)
Albumin: 4.7 g/dL (ref 3.5–5.0)
Alkaline Phosphatase: 72 U/L (ref 38–126)
Anion gap: 8 (ref 5–15)
BUN: 13 mg/dL (ref 8–23)
CO2: 27 mmol/L (ref 22–32)
Calcium: 9.8 mg/dL (ref 8.9–10.3)
Chloride: 104 mmol/L (ref 98–111)
Creatinine: 1.09 mg/dL (ref 0.61–1.24)
GFR, Est AFR Am: >60 mL/min (ref >60)
GFR, Estimated: >60 mL/min (ref >60)
Glucose, Bld: 129 mg/dL — ABNORMAL HIGH (ref 70–99)
Potassium: 3.6 mmol/L (ref 3.5–5.1)
Sodium: 139 mmol/L (ref 135–145)
Total Bilirubin: 0.6 mg/dL (ref 0.3–1.2)
Total Protein: 7.7 g/dL (ref 6.5–8.1)

## 2019-03-16 MED ORDER — SODIUM CHLORIDE 0.9 % IV SOLN
Freq: Once | INTRAVENOUS | Status: AC
  Administered 2019-03-16: 11:00:00 via INTRAVENOUS
  Filled 2019-03-16: qty 5

## 2019-03-16 MED ORDER — SODIUM CHLORIDE 0.9 % IV SOLN
75.0000 mg/m2 | Freq: Once | INTRAVENOUS | Status: AC
  Administered 2019-03-16: 160 mg via INTRAVENOUS
  Filled 2019-03-16: qty 16

## 2019-03-16 MED ORDER — SODIUM CHLORIDE 0.9 % IV SOLN
Freq: Once | INTRAVENOUS | Status: AC
  Administered 2019-03-16: 11:00:00 via INTRAVENOUS
  Filled 2019-03-16: qty 250

## 2019-03-16 MED ORDER — SODIUM CHLORIDE 0.9 % IV SOLN
647.4000 mg | Freq: Once | INTRAVENOUS | Status: AC
  Administered 2019-03-16: 14:00:00 650 mg via INTRAVENOUS
  Filled 2019-03-16: qty 65

## 2019-03-16 MED ORDER — PALONOSETRON HCL INJECTION 0.25 MG/5ML
INTRAVENOUS | Status: AC
  Filled 2019-03-16: qty 5

## 2019-03-16 MED ORDER — HEPARIN SOD (PORK) LOCK FLUSH 100 UNIT/ML IV SOLN
500.0000 [IU] | Freq: Once | INTRAVENOUS | Status: AC | PRN
  Administered 2019-03-16: 500 [IU]
  Filled 2019-03-16: qty 5

## 2019-03-16 MED ORDER — SODIUM CHLORIDE 0.9% FLUSH
10.0000 mL | INTRAVENOUS | Status: DC | PRN
  Administered 2019-03-16: 10 mL
  Filled 2019-03-16: qty 10

## 2019-03-16 MED ORDER — PALONOSETRON HCL INJECTION 0.25 MG/5ML
0.2500 mg | Freq: Once | INTRAVENOUS | Status: AC
  Administered 2019-03-16: 0.25 mg via INTRAVENOUS

## 2019-03-16 NOTE — Patient Instructions (Addendum)
[facility] Discharge Instructions for Patients Receiving Chemotherapy  Today you received the following chemotherapy agents Taxotere, Carboplatin   To help prevent nausea and vomiting after your treatment, we encourage you to take your nausea medication   1) You can take Compazine (Prochlorperazine) by mouth every 6 hours as needed for nausea or vomiting  2) You can also take Ativan (Lorazepam) by mouth every 6 hours as needed for nausea, vomiting, anxiety, sleep. Do not drink alcohol while taking this medication.  3) Continue taking Dexamethasone ( Decadron) for 3 days beginning Wednesday.  4) Beginning Friday June 5, you can take Zofran (Ondansetron) by mouth twice daily for nausea if you are still experiencing nausea.    Can Begin Claritin by mouth to help alleviate bone aches and pains associated with Neulasta.   If you develop nausea and vomiting that is not controlled by your nausea medication, call the clinic.   BELOW ARE SYMPTOMS THAT SHOULD BE REPORTED IMMEDIATELY:  *FEVER GREATER THAN 100.5 F  *CHILLS WITH OR WITHOUT FEVER  NAUSEA AND VOMITING THAT IS NOT CONTROLLED WITH YOUR NAUSEA MEDICATION  *UNUSUAL SHORTNESS OF BREATH  *UNUSUAL BRUISING OR BLEEDING  TENDERNESS IN MOUTH AND THROAT WITH OR WITHOUT PRESENCE OF ULCERS  *URINARY PROBLEMS  *BOWEL PROBLEMS  UNUSUAL RASH Items with * indicate a potential emergency and should be followed up as soon as possible.  Feel free to call the clinic should you have any questions or concerns. The clinic phone number is [phone].  Please show the [facility] at check-in to the Emergency Department and triage nurse.

## 2019-03-16 NOTE — Progress Notes (Signed)
LAbs done 03/12/2019.

## 2019-03-16 NOTE — Progress Notes (Unsigned)
Patient in chemotherapy education class alone.  Beginning Chemotherapy today.                  .  Discussed side effects of Carboplatin and Taxotere  which include but are not limited to myelosuppression, decreased appetite, fatigue, fever, allergic or infusional reaction, mucositis, cardiac toxicity, cough, SOB, altered taste, nausea and vomiting, diarrhea, constipation, elevated LFTs myalgia and arthralgias, hair loss or thinning, rash, skin dryness, nail changes, peripheral neuropathy, discolored urine, delayed wound healing, mental changes (Chemo brain), increased risk of infections, weight loss.  Reviewed infusion room and office policy and procedure and phone numbers 24 hours x 7 days a week.   Reviewed when to call the office with any concerns or problems.  Scientist, clinical (histocompatibility and immunogenetics) given.  Discussed portacath insertion and EMLA cream administration.  Antiemetic protocol and chemotherapy schedule reviewed. Patient verbalized understanding of chemotherapy indications and possible side effects.  Teachback done

## 2019-03-16 NOTE — Patient Instructions (Signed)
Implanted Port Insertion, Care After  This sheet gives you information about how to care for yourself after your procedure. Your health care provider may also give you more specific instructions. If you have problems or questions, contact your health care provider.  What can I expect after the procedure?  After the procedure, it is common to have:  · Discomfort at the port insertion site.  · Bruising on the skin over the port. This should improve over 3-4 days.  Follow these instructions at home:  Port care  · After your port is placed, you will get a manufacturer's information card. The card has information about your port. Keep this card with you at all times.  · Take care of the port as told by your health care provider. Ask your health care provider if you or a family member can get training for taking care of the port at home. A home health care nurse may also take care of the port.  · Make sure to remember what type of port you have.  Incision care         · Follow instructions from your health care provider about how to take care of your port insertion site. Make sure you:  ? Wash your hands with soap and water before and after you change your bandage (dressing). If soap and water are not available, use hand sanitizer.  ? Change your dressing as told by your health care provider.  ? Leave stitches (sutures), skin glue, or adhesive strips in place. These skin closures may need to stay in place for 2 weeks or longer. If adhesive strip edges start to loosen and curl up, you may trim the loose edges. Do not remove adhesive strips completely unless your health care provider tells you to do that.  · Check your port insertion site every day for signs of infection. Check for:  ? Redness, swelling, or pain.  ? Fluid or blood.  ? Warmth.  ? Pus or a bad smell.  Activity  · Return to your normal activities as told by your health care provider. Ask your health care provider what activities are safe for you.  · Do not  lift anything that is heavier than 10 lb (4.5 kg), or the limit that you are told, until your health care provider says that it is safe.  General instructions  · Take over-the-counter and prescription medicines only as told by your health care provider.  · Do not take baths, swim, or use a hot tub until your health care provider approves. Ask your health care provider if you may take showers. You may only be allowed to take sponge baths.  · Do not drive for 24 hours if you were given a sedative during your procedure.  · Wear a medical alert bracelet in case of an emergency. This will tell any health care providers that you have a port.  · Keep all follow-up visits as told by your health care provider. This is important.  Contact a health care provider if:  · You cannot flush your port with saline as directed, or you cannot draw blood from the port.  · You have a fever or chills.  · You have redness, swelling, or pain around your port insertion site.  · You have fluid or blood coming from your port insertion site.  · Your port insertion site feels warm to the touch.  · You have pus or a bad smell coming from the port   insertion site.  Get help right away if:  · You have chest pain or shortness of breath.  · You have bleeding from your port that you cannot control.  Summary  · Take care of the port as told by your health care provider. Keep the manufacturer's information card with you at all times.  · Change your dressing as told by your health care provider.  · Contact a health care provider if you have a fever or chills or if you have redness, swelling, or pain around your port insertion site.  · Keep all follow-up visits as told by your health care provider.  This information is not intended to replace advice given to you by your health care provider. Make sure you discuss any questions you have with your health care provider.  Document Released: 07/21/2013 Document Revised: 04/28/2018 Document Reviewed:  04/28/2018  Elsevier Interactive Patient Education © 2019 [employer]

## 2019-03-17 ENCOUNTER — Telehealth: Payer: Self-pay | Admitting: *Deleted

## 2019-03-17 NOTE — Telephone Encounter (Signed)
Called pt to check on status after 1st treatment 6/2. lmovm with message to call office to discuss any concerns.

## 2019-03-18 ENCOUNTER — Other Ambulatory Visit: Payer: Self-pay

## 2019-03-18 ENCOUNTER — Inpatient Hospital Stay: Payer: BLUE CROSS/BLUE SHIELD

## 2019-03-18 ENCOUNTER — Encounter (HOSPITAL_COMMUNITY): Payer: Self-pay

## 2019-03-18 VITALS — BP 139/82 | HR 72 | Temp 98.8°F | Resp 16

## 2019-03-18 DIAGNOSIS — C01 Malignant neoplasm of base of tongue: Secondary | ICD-10-CM

## 2019-03-18 MED ORDER — PEGFILGRASTIM-CBQV 6 MG/0.6ML ~~LOC~~ SOSY
PREFILLED_SYRINGE | SUBCUTANEOUS | Status: AC
  Filled 2019-03-18: qty 0.6

## 2019-03-18 MED ORDER — PEGFILGRASTIM-CBQV 6 MG/0.6ML ~~LOC~~ SOSY
6.0000 mg | PREFILLED_SYRINGE | Freq: Once | SUBCUTANEOUS | Status: AC
  Administered 2019-03-18: 6 mg via SUBCUTANEOUS

## 2019-03-18 NOTE — Patient Instructions (Signed)
Pegfilgrastim injection  What is this medicine?  PEGFILGRASTIM (PEG fil gra stim) is a long-acting granulocyte colony-stimulating factor that stimulates the growth of neutrophils, a type of white blood cell important in the body's fight against infection. It is used to reduce the incidence of fever and infection in patients with certain types of cancer who are receiving chemotherapy that affects the bone marrow, and to increase survival after being exposed to high doses of radiation.  This medicine may be used for other purposes; ask your health care provider or pharmacist if you have questions.  COMMON BRAND NAME(S): Fulphila, Neulasta, UDENYCA  What should I tell my health care provider before I take this medicine?  They need to know if you have any of these conditions:  -kidney disease  -latex allergy  -ongoing radiation therapy  -sickle cell disease  -skin reactions to acrylic adhesives (On-Body Injector only)  -an unusual or allergic reaction to pegfilgrastim, filgrastim, other medicines, foods, dyes, or preservatives  -pregnant or trying to get pregnant  -breast-feeding  How should I use this medicine?  This medicine is for injection under the skin. If you get this medicine at home, you will be taught how to prepare and give the pre-filled syringe or how to use the On-body Injector. Refer to the patient Instructions for Use for detailed instructions. Use exactly as directed. Tell your healthcare provider immediately if you suspect that the On-body Injector may not have performed as intended or if you suspect the use of the On-body Injector resulted in a missed or partial dose.  It is important that you put your used needles and syringes in a special sharps container. Do not put them in a trash can. If you do not have a sharps container, call your pharmacist or healthcare provider to get one.  Talk to your pediatrician regarding the use of this medicine in children. While this drug may be prescribed for  selected conditions, precautions do apply.  Overdosage: If you think you have taken too much of this medicine contact a poison control center or emergency room at once.  NOTE: This medicine is only for you. Do not share this medicine with others.  What if I miss a dose?  It is important not to miss your dose. Call your doctor or health care professional if you miss your dose. If you miss a dose due to an On-body Injector failure or leakage, a new dose should be administered as soon as possible using a single prefilled syringe for manual use.  What may interact with this medicine?  Interactions have not been studied.  Give your health care provider a list of all the medicines, herbs, non-prescription drugs, or dietary supplements you use. Also tell them if you smoke, drink alcohol, or use illegal drugs. Some items may interact with your medicine.  This list may not describe all possible interactions. Give your health care provider a list of all the medicines, herbs, non-prescription drugs, or dietary supplements you use. Also tell them if you smoke, drink alcohol, or use illegal drugs. Some items may interact with your medicine.  What should I watch for while using this medicine?  You may need blood work done while you are taking this medicine.  If you are going to need a MRI, CT scan, or other procedure, tell your doctor that you are using this medicine (On-Body Injector only).  What side effects may I notice from receiving this medicine?  Side effects that you should report to   your doctor or health care professional as soon as possible:  -allergic reactions like skin rash, itching or hives, swelling of the face, lips, or tongue  -back pain  -dizziness  -fever  -pain, redness, or irritation at site where injected  -pinpoint red spots on the skin  -red or dark-brown urine  -shortness of breath or breathing problems  -stomach or side pain, or pain at the shoulder  -swelling  -tiredness  -trouble passing urine or  change in the amount of urine  Side effects that usually do not require medical attention (report to your doctor or health care professional if they continue or are bothersome):  -bone pain  -muscle pain  This list may not describe all possible side effects. Call your doctor for medical advice about side effects. You may report side effects to FDA at 1-800-FDA-1088.  Where should I keep my medicine?  Keep out of the reach of children.  If you are using this medicine at home, you will be instructed on how to store it. Throw away any unused medicine after the expiration date on the label.  NOTE: This sheet is a summary. It may not cover all possible information. If you have questions about this medicine, talk to your doctor, pharmacist, or health care provider.   2019 Elsevier/Gold Standard (2018-01-05 16:57:08)

## 2019-03-19 ENCOUNTER — Other Ambulatory Visit: Payer: Self-pay | Admitting: Family

## 2019-03-31 ENCOUNTER — Other Ambulatory Visit: Payer: Self-pay | Admitting: Hematology

## 2019-04-01 ENCOUNTER — Encounter: Payer: Self-pay | Admitting: *Deleted

## 2019-04-01 ENCOUNTER — Other Ambulatory Visit (HOSPITAL_COMMUNITY): Payer: Self-pay

## 2019-04-01 DIAGNOSIS — R1310 Dysphagia, unspecified: Secondary | ICD-10-CM

## 2019-04-02 ENCOUNTER — Ambulatory Visit (HOSPITAL_COMMUNITY)
Admission: RE | Admit: 2019-04-02 | Discharge: 2019-04-02 | Disposition: A | Discharge status: Home / Self Care | Payer: BLUE CROSS/BLUE SHIELD | Source: Ambulatory Visit | Attending: Hematology | Admitting: Hematology

## 2019-04-02 ENCOUNTER — Other Ambulatory Visit: Payer: Self-pay

## 2019-04-02 DIAGNOSIS — T17908A Unspecified foreign body in respiratory tract, part unspecified causing other injury, initial encounter: Secondary | ICD-10-CM

## 2019-04-02 DIAGNOSIS — R1310 Dysphagia, unspecified: Secondary | ICD-10-CM | POA: Insufficient documentation

## 2019-04-02 NOTE — Progress Notes (Signed)
Modified Barium Swallow Progress Note  Patient Details  Name: Jack Price MRN: [id number] Date of Birth: [birth year]  Today's Date: 04/02/2019  Modified Barium Swallow completed.  Full report located under Chart Review in the Imaging Section.  Brief recommendations include the following:  Clinical Impression  Pt demonstrates no oropharyngeal dysphagia. No deficits noted other than hoarse vocal quality. Vallecular cavity still fully patent. 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 Suspect that pt may have occasional trace aspiration events if swallow is slightly mistimed. Small sips and increased attention to swallowing are adeuquate interventions for now. If function worsens pt may experiment with a head turn or chin tuck, but may require f/u with SLP in the future. No interventions needed at this time.    Swallow Evaluation Recommendations       SLP Diet Recommendations: Regular solids;Thin liquid   Liquid Administration via: Cup;Straw                          Herbie Baltimore, MA [place] Pager [phone] Office [phone]   Lynann Beaver 04/02/2019,12:22 PM

## 2019-04-06 ENCOUNTER — Ambulatory Visit: Payer: BLUE CROSS/BLUE SHIELD

## 2019-04-06 ENCOUNTER — Encounter: Payer: Self-pay | Admitting: Family

## 2019-04-06 ENCOUNTER — Inpatient Hospital Stay: Payer: BLUE CROSS/BLUE SHIELD

## 2019-04-06 ENCOUNTER — Other Ambulatory Visit: Payer: BLUE CROSS/BLUE SHIELD

## 2019-04-06 ENCOUNTER — Inpatient Hospital Stay (HOSPITAL_BASED_OUTPATIENT_CLINIC_OR_DEPARTMENT_OTHER): Payer: BLUE CROSS/BLUE SHIELD | Admitting: Family

## 2019-04-06 ENCOUNTER — Ambulatory Visit: Payer: BLUE CROSS/BLUE SHIELD | Admitting: Hematology

## 2019-04-06 ENCOUNTER — Other Ambulatory Visit: Payer: Self-pay

## 2019-04-06 VITALS — BP 153/85 | HR 87 | Temp 98.7°F | Resp 18 | Ht 71.0 in | Wt 188.0 lb

## 2019-04-06 DIAGNOSIS — C01 Malignant neoplasm of base of tongue: Secondary | ICD-10-CM

## 2019-04-06 DIAGNOSIS — T451X5S Adverse effect of antineoplastic and immunosuppressive drugs, sequela: Secondary | ICD-10-CM

## 2019-04-06 DIAGNOSIS — D701 Agranulocytosis secondary to cancer chemotherapy: Secondary | ICD-10-CM

## 2019-04-06 LAB — CBC WITH DIFFERENTIAL (CANCER CENTER ONLY)
Abs Immature Granulocytes: 0.12 10*3/uL — ABNORMAL HIGH (ref 0.00–0.07)
Basophils Absolute: 0 10*3/uL (ref 0.0–0.1)
Basophils Relative: 0 %
Eosinophils Absolute: 0 10*3/uL (ref 0.0–0.5)
Eosinophils Relative: 0 %
HCT: 31.7 % — ABNORMAL LOW (ref 39.0–52.0)
Hemoglobin: 10.5 g/dL — ABNORMAL LOW (ref 13.0–17.0)
Immature Granulocytes: 1 %
Lymphocytes Relative: 4 %
Lymphs Abs: 0.7 10*3/uL (ref 0.7–4.0)
MCH: 32.4 pg (ref 26.0–34.0)
MCHC: 33.1 g/dL (ref 30.0–36.0)
MCV: 97.8 fL (ref 80.0–100.0)
Monocytes Absolute: 0.9 10*3/uL (ref 0.1–1.0)
Monocytes Relative: 6 %
Neutro Abs: 14.2 10*3/uL — ABNORMAL HIGH (ref 1.7–7.7)
Neutrophils Relative %: 89 %
Platelet Count: 373 10*3/uL (ref 150–400)
RBC: 3.24 MIL/uL — ABNORMAL LOW (ref 4.22–5.81)
RDW: 11.9 % (ref 11.5–15.5)
WBC Count: 16 10*3/uL — ABNORMAL HIGH (ref 4.0–10.5)
nRBC: 0 % (ref 0.0–0.2)

## 2019-04-06 LAB — CMP (CANCER CENTER ONLY)
ALT: 32 U/L (ref 0–44)
AST: 19 U/L (ref 15–41)
Albumin: 4.3 g/dL (ref 3.5–5.0)
Alkaline Phosphatase: 96 U/L (ref 38–126)
Anion gap: 10 (ref 5–15)
BUN: 16 mg/dL (ref 8–23)
CO2: 24 mmol/L (ref 22–32)
Calcium: 10.6 mg/dL — ABNORMAL HIGH (ref 8.9–10.3)
Chloride: 102 mmol/L (ref 98–111)
Creatinine: 1.01 mg/dL (ref 0.61–1.24)
GFR, Est AFR Am: >60 mL/min (ref >60)
GFR, Estimated: >60 mL/min (ref >60)
Glucose, Bld: 82 mg/dL (ref 70–99)
Potassium: 4 mmol/L (ref 3.5–5.1)
Sodium: 136 mmol/L (ref 135–145)
Total Bilirubin: 0.3 mg/dL (ref 0.3–1.2)
Total Protein: 7.3 g/dL (ref 6.5–8.1)

## 2019-04-06 MED ORDER — PALONOSETRON HCL INJECTION 0.25 MG/5ML
INTRAVENOUS | Status: AC
  Filled 2019-04-06: qty 5

## 2019-04-06 MED ORDER — COLD PACK MISC ONCOLOGY
1.0000 | Freq: Once | Status: DC | PRN
  Filled 2019-04-06: qty 1

## 2019-04-06 MED ORDER — SODIUM CHLORIDE 0.9% FLUSH
10.0000 mL | INTRAVENOUS | Status: DC | PRN
  Administered 2019-04-06: 16:00:00 10 mL
  Filled 2019-04-06: qty 10

## 2019-04-06 MED ORDER — SODIUM CHLORIDE 0.9 % IV SOLN
75.0000 mg/m2 | Freq: Once | INTRAVENOUS | Status: AC
  Administered 2019-04-06: 160 mg via INTRAVENOUS
  Filled 2019-04-06: qty 16

## 2019-04-06 MED ORDER — SODIUM CHLORIDE 0.9 % IV SOLN
650.0000 mg | Freq: Once | INTRAVENOUS | Status: AC
  Administered 2019-04-06: 650 mg via INTRAVENOUS
  Filled 2019-04-06: qty 65

## 2019-04-06 MED ORDER — SODIUM CHLORIDE 0.9 % IV SOLN
Freq: Once | INTRAVENOUS | Status: AC
  Administered 2019-04-06: 12:00:00 via INTRAVENOUS
  Filled 2019-04-06: qty 250

## 2019-04-06 MED ORDER — SODIUM CHLORIDE 0.9% FLUSH
3.0000 mL | INTRAVENOUS | Status: DC | PRN
  Filled 2019-04-06: qty 10

## 2019-04-06 MED ORDER — PALONOSETRON HCL INJECTION 0.25 MG/5ML
0.2500 mg | Freq: Once | INTRAVENOUS | Status: AC
  Administered 2019-04-06: 0.25 mg via INTRAVENOUS

## 2019-04-06 MED ORDER — HEPARIN SOD (PORK) LOCK FLUSH 100 UNIT/ML IV SOLN
500.0000 [IU] | Freq: Once | INTRAVENOUS | Status: AC
  Administered 2019-04-06: 500 [IU] via INTRAVENOUS
  Filled 2019-04-06: qty 5

## 2019-04-06 MED ORDER — SODIUM CHLORIDE 0.9 % IV SOLN
Freq: Once | INTRAVENOUS | Status: AC
  Administered 2019-04-06: 13:00:00 via INTRAVENOUS
  Filled 2019-04-06: qty 5

## 2019-04-06 NOTE — Progress Notes (Signed)
Hematology and Oncology Follow Up Visit  Jack Price [id number] 1956/06/08 63 y.o. 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 With his first cycle, he had fatigue and pain when swallowing. He did have a swallowing eval which was negative. These symptoms have since resolved. He has responded nicely to Zofran and Ativan when needed.   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 His left eye is looking much better and his voice seems less hoarse.  No blurriness or loss of vision.  Palpable left neck mass noted on exam. No lymphadenopathy found on today's exam.  He denies pain at this time.  He will have some intermittent fatigue and get mildy SOB with exertion such as with stairs.  He has dizziness which he attributes to vertigo due to the neck mass that is positional when he lays a certain way.  No fever, chills, n/v, cough, rash, chest pain, palpitations, abdominal pain or changes in bowel or bladder habits.  No swelling, tenderness, numbness or tingling in her extremities.  He is eating better and staying well hydrated. His weight is down 12 lbs likely due to his issues with swallowing and being unable to eat well during his first week after treatment.   ECOG Performance Status: 1 - Symptomatic but completely ambulatory  Medications:  Allergies as of 04/06/2019      Reactions   Iodinated Diagnostic Agents Hives   Patient had 5 hives and itchy eyes after getting IV contrast for CT scan.      Medication List       Accurate as of April 06, 2019 11:00 AM. If you have any questions, ask your nurse or doctor.        STOP taking these medications   pseudoephedrine 60 MG tablet Commonly  known as: SUDAFED Stopped by: Laverna Peace, NP     TAKE these medications   acetaminophen 325 MG tablet Commonly known as: TYLENOL Take 650 mg by mouth every 4 (four) hours as needed for mild pain.   dexamethasone 4 MG tablet Commonly known as: DECADRON Take 2 tablets (8 mg total) by mouth 2 (two) times daily. Start the day before Taxotere. Then again the day after chemo for 3 days.   ibuprofen 200 MG tablet Commonly known as: ADVIL Take 200 mg by mouth every 6 (six) hours as needed for moderate pain.   lidocaine-prilocaine cream Commonly known as: EMLA Apply to affected area once   loratadine 10 MG tablet Commonly known as: CLARITIN Take 10 mg by mouth daily.   LORazepam 0.5 MG tablet Commonly known as: Ativan Take 1 tablet (0.5 mg total) by mouth every 6 (six) hours as needed (Nausea or vomiting).   ondansetron 8 MG tablet Commonly known as: Zofran Take 1 tablet (8 mg total) by mouth 2 (two) times daily as needed for refractory nausea / vomiting. Start on day 3 after chemo.   prochlorperazine 10 MG tablet Commonly known as: COMPAZINE Take 1 tablet (10 mg total) by mouth every 6 (six) hours as needed (Nausea or vomiting).       Allergies:  Allergies  Allergen Reactions  . Iodinated Diagnostic Agents Hives    Patient had 5  hives and itchy eyes after getting IV contrast for CT scan.    Past Medical History, Surgical history, Social history, and Family History were reviewed and updated.  Review of Systems: All other 10 point review of systems is negative.   Physical Exam:  height is '5\' 11"'$  (1.803 m) and weight is 188 lb (85.3 kg). His oral temperature is 98.7 F (37.1 C). His blood pressure is 153/85 (abnormal) and his pulse is 87. His respiration is 18 and oxygen saturation is 99%.   Wt Readings from Last 3 Encounters:  04/06/19 188 lb (85.3 kg)  03/09/19 200 lb (90.7 kg)  03/02/19 200 lb (90.7 kg)    Ocular: Sclerae unicteric, pupils equal, round and  reactive to light Ear-nose-throat: Oropharynx clear, dentition fair Lymphatic: No cervical or supraclavicular adenopathy Lungs no rales or rhonchi, good excursion bilaterally Heart regular rate and rhythm, no murmur appreciated Abd soft, nontender, positive bowel sounds, no liver or spleen tip palpated on exam, no fluid wave  MSK no focal spinal tenderness, no joint edema Neuro: non-focal, well-oriented, appropriate affect Breasts: Deferred   Lab Results  Component Value Date   WBC 16.0 (H) 04/06/2019   HGB 10.5 (L) 04/06/2019   HCT 31.7 (L) 04/06/2019   MCV 97.8 04/06/2019   PLT 373 04/06/2019   No results found for: FERRITIN, IRON, TIBC, UIBC, IRONPCTSAT Lab Results  Component Value Date   RBC 3.24 (L) 04/06/2019   No results found for: KPAFRELGTCHN, LAMBDASER, KAPLAMBRATIO No results found for: IGGSERUM, IGA, IGMSERUM No results found for: Kathrynn Ducking, MSPIKE, SPEI   Chemistry      Component Value Date/Time   NA 136 04/06/2019 1010   K 4.0 04/06/2019 1010   CL 102 04/06/2019 1010   CO2 24 04/06/2019 1010   BUN 16 04/06/2019 1010   CREATININE 1.01 04/06/2019 1010      Component Value Date/Time   CALCIUM 10.6 (H) 04/06/2019 1010   ALKPHOS 96 04/06/2019 1010   AST 19 04/06/2019 1010   ALT 32 04/06/2019 1010   BILITOT 0.3 04/06/2019 1010       Impression and Plan: Jack Price is a very pleasant 63 yo gentleman with 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   He is feeling better and states that he has gotten into a routine with his zofran and ativan when needed.   We will proceed with treatment today as planned.  Her received Udenyca on 6/4 and WBC count is 16.0 at this time. We will see him back in another 3 weeks.  He states that he will contact our office with any new questions or concerns that may come up. We can certainly see him sooner if needed.  I also gave him 2 chemo cards with the after  hours contact info for symptom management in case he needs anything.   Laverna Peace, NP 6/23/202011:00 AM

## 2019-04-06 NOTE — Patient Instructions (Signed)
[facility] Discharge Instructions for Patients Receiving Chemotherapy  Today you received the following chemotherapy agents Carboplatin, Taxotere  To help prevent nausea and vomiting after your treatment, we encourage you to take your nausea medication    If you develop nausea and vomiting that is not controlled by your nausea medication, call the clinic.   BELOW ARE SYMPTOMS THAT SHOULD BE REPORTED IMMEDIATELY:  *FEVER GREATER THAN 100.5 F  *CHILLS WITH OR WITHOUT FEVER  NAUSEA AND VOMITING THAT IS NOT CONTROLLED WITH YOUR NAUSEA MEDICATION  *UNUSUAL SHORTNESS OF BREATH  *UNUSUAL BRUISING OR BLEEDING  TENDERNESS IN MOUTH AND THROAT WITH OR WITHOUT PRESENCE OF ULCERS  *URINARY PROBLEMS  *BOWEL PROBLEMS  UNUSUAL RASH Items with * indicate a potential emergency and should be followed up as soon as possible.  Feel free to call the clinic should you have any questions or concerns. The clinic phone number is [phone].  Please show the [facility] at check-in to the Emergency Department and triage nurse.

## 2019-04-07 ENCOUNTER — Other Ambulatory Visit: Payer: Self-pay | Admitting: Hematology

## 2019-04-07 DIAGNOSIS — C01 Malignant neoplasm of base of tongue: Secondary | ICD-10-CM

## 2019-04-08 ENCOUNTER — Inpatient Hospital Stay: Payer: BLUE CROSS/BLUE SHIELD

## 2019-04-08 ENCOUNTER — Other Ambulatory Visit: Payer: Self-pay

## 2019-04-08 VITALS — BP 129/87 | HR 80 | Temp 98.8°F | Resp 18

## 2019-04-08 DIAGNOSIS — C01 Malignant neoplasm of base of tongue: Secondary | ICD-10-CM | POA: Diagnosis not present

## 2019-04-08 MED ORDER — PEGFILGRASTIM INJECTION 6 MG/0.6ML ~~LOC~~
6.0000 mg | PREFILLED_SYRINGE | Freq: Once | SUBCUTANEOUS | Status: AC
  Administered 2019-04-08: 6 mg via SUBCUTANEOUS

## 2019-04-08 MED ORDER — PEGFILGRASTIM INJECTION 6 MG/0.6ML ~~LOC~~
PREFILLED_SYRINGE | SUBCUTANEOUS | Status: AC
  Filled 2019-04-08: qty 0.6

## 2019-04-08 NOTE — Patient Instructions (Signed)
Pegfilgrastim injection  What is this medicine?  PEGFILGRASTIM (PEG fil gra stim) is a long-acting granulocyte colony-stimulating factor that stimulates the growth of neutrophils, a type of white blood cell important in the body's fight against infection. It is used to reduce the incidence of fever and infection in patients with certain types of cancer who are receiving chemotherapy that affects the bone marrow, and to increase survival after being exposed to high doses of radiation.  This medicine may be used for other purposes; ask your health care provider or pharmacist if you have questions.  COMMON BRAND NAME(S): Fulphila, Neulasta, UDENYCA  What should I tell my health care provider before I take this medicine?  They need to know if you have any of these conditions:  -kidney disease  -latex allergy  -ongoing radiation therapy  -sickle cell disease  -skin reactions to acrylic adhesives (On-Body Injector only)  -an unusual or allergic reaction to pegfilgrastim, filgrastim, other medicines, foods, dyes, or preservatives  -pregnant or trying to get pregnant  -breast-feeding  How should I use this medicine?  This medicine is for injection under the skin. If you get this medicine at home, you will be taught how to prepare and give the pre-filled syringe or how to use the On-body Injector. Refer to the patient Instructions for Use for detailed instructions. Use exactly as directed. Tell your healthcare provider immediately if you suspect that the On-body Injector may not have performed as intended or if you suspect the use of the On-body Injector resulted in a missed or partial dose.  It is important that you put your used needles and syringes in a special sharps container. Do not put them in a trash can. If you do not have a sharps container, call your pharmacist or healthcare provider to get one.  Talk to your pediatrician regarding the use of this medicine in children. While this drug may be prescribed for  selected conditions, precautions do apply.  Overdosage: If you think you have taken too much of this medicine contact a poison control center or emergency room at once.  NOTE: This medicine is only for you. Do not share this medicine with others.  What if I miss a dose?  It is important not to miss your dose. Call your doctor or health care professional if you miss your dose. If you miss a dose due to an On-body Injector failure or leakage, a new dose should be administered as soon as possible using a single prefilled syringe for manual use.  What may interact with this medicine?  Interactions have not been studied.  Give your health care provider a list of all the medicines, herbs, non-prescription drugs, or dietary supplements you use. Also tell them if you smoke, drink alcohol, or use illegal drugs. Some items may interact with your medicine.  This list may not describe all possible interactions. Give your health care provider a list of all the medicines, herbs, non-prescription drugs, or dietary supplements you use. Also tell them if you smoke, drink alcohol, or use illegal drugs. Some items may interact with your medicine.  What should I watch for while using this medicine?  You may need blood work done while you are taking this medicine.  If you are going to need a MRI, CT scan, or other procedure, tell your doctor that you are using this medicine (On-Body Injector only).  What side effects may I notice from receiving this medicine?  Side effects that you should report to   your doctor or health care professional as soon as possible:  -allergic reactions like skin rash, itching or hives, swelling of the face, lips, or tongue  -back pain  -dizziness  -fever  -pain, redness, or irritation at site where injected  -pinpoint red spots on the skin  -red or dark-brown urine  -shortness of breath or breathing problems  -stomach or side pain, or pain at the shoulder  -swelling  -tiredness  -trouble passing urine or  change in the amount of urine  Side effects that usually do not require medical attention (report to your doctor or health care professional if they continue or are bothersome):  -bone pain  -muscle pain  This list may not describe all possible side effects. Call your doctor for medical advice about side effects. You may report side effects to FDA at 1-800-FDA-1088.  Where should I keep my medicine?  Keep out of the reach of children.  If you are using this medicine at home, you will be instructed on how to store it. Throw away any unused medicine after the expiration date on the label.  NOTE: This sheet is a summary. It may not cover all possible information. If you have questions about this medicine, talk to your doctor, pharmacist, or health care provider.   2019 Elsevier/Gold Standard (2018-01-05 16:57:08)

## 2019-04-27 ENCOUNTER — Inpatient Hospital Stay: Payer: BLUE CROSS/BLUE SHIELD | Attending: Hematology

## 2019-04-27 ENCOUNTER — Inpatient Hospital Stay: Payer: BLUE CROSS/BLUE SHIELD

## 2019-04-27 ENCOUNTER — Encounter: Payer: Self-pay | Admitting: Hematology

## 2019-04-27 ENCOUNTER — Telehealth: Payer: Self-pay | Admitting: Hematology

## 2019-04-27 ENCOUNTER — Other Ambulatory Visit: Payer: Self-pay

## 2019-04-27 ENCOUNTER — Inpatient Hospital Stay (HOSPITAL_BASED_OUTPATIENT_CLINIC_OR_DEPARTMENT_OTHER): Payer: BLUE CROSS/BLUE SHIELD | Admitting: Hematology

## 2019-04-27 VITALS — BP 141/84 | HR 88 | Resp 18

## 2019-04-27 VITALS — BP 139/87 | HR 91 | Temp 98.1°F | Resp 18 | Ht 71.0 in | Wt 188.2 lb

## 2019-04-27 DIAGNOSIS — G902 Horner's syndrome: Secondary | ICD-10-CM

## 2019-04-27 DIAGNOSIS — D6481 Anemia due to antineoplastic chemotherapy: Secondary | ICD-10-CM

## 2019-04-27 DIAGNOSIS — C01 Malignant neoplasm of base of tongue: Secondary | ICD-10-CM | POA: Diagnosis present

## 2019-04-27 DIAGNOSIS — Z79899 Other long term (current) drug therapy: Secondary | ICD-10-CM

## 2019-04-27 DIAGNOSIS — T451X5A Adverse effect of antineoplastic and immunosuppressive drugs, initial encounter: Secondary | ICD-10-CM | POA: Diagnosis not present

## 2019-04-27 DIAGNOSIS — K1231 Oral mucositis (ulcerative) due to antineoplastic therapy: Secondary | ICD-10-CM

## 2019-04-27 DIAGNOSIS — Z5111 Encounter for antineoplastic chemotherapy: Secondary | ICD-10-CM | POA: Insufficient documentation

## 2019-04-27 DIAGNOSIS — D72829 Elevated white blood cell count, unspecified: Secondary | ICD-10-CM | POA: Insufficient documentation

## 2019-04-27 DIAGNOSIS — C787 Secondary malignant neoplasm of liver and intrahepatic bile duct: Secondary | ICD-10-CM | POA: Insufficient documentation

## 2019-04-27 DIAGNOSIS — R110 Nausea: Secondary | ICD-10-CM

## 2019-04-27 LAB — CBC WITH DIFFERENTIAL (CANCER CENTER ONLY)
Band Neutrophils: 0 %
Basophils Absolute: 0 10*3/uL (ref 0.0–0.1)
Basophils Relative: 0 %
Eosinophils Absolute: 0 10*3/uL (ref 0.0–0.5)
Eosinophils Relative: 0 %
HCT: 31.9 % — ABNORMAL LOW (ref 39.0–52.0)
Hemoglobin: 10.4 g/dL — ABNORMAL LOW (ref 13.0–17.0)
Lymphocytes Relative: 5 %
Lymphs Abs: 0.7 10*3/uL (ref 0.7–4.0)
MCH: 33.1 pg (ref 26.0–34.0)
MCHC: 32.6 g/dL (ref 30.0–36.0)
MCV: 101.6 fL — ABNORMAL HIGH (ref 80.0–100.0)
Monocytes Absolute: 0.7 10*3/uL (ref 0.1–1.0)
Monocytes Relative: 5 %
Neutro Abs: 12.3 10*3/uL — ABNORMAL HIGH (ref 1.7–7.7)
Neutrophils Relative %: 89 %
Platelet Count: 186 10*3/uL (ref 150–400)
RBC: 3.14 MIL/uL — ABNORMAL LOW (ref 4.22–5.81)
RDW: 14.7 % (ref 11.5–15.5)
WBC Count: 13.9 10*3/uL — ABNORMAL HIGH (ref 4.0–10.5)
nRBC: 0 % (ref 0.0–0.2)

## 2019-04-27 LAB — CMP (CANCER CENTER ONLY)
ALT: 16 U/L (ref 0–44)
AST: 15 U/L (ref 15–41)
Albumin: 4.5 g/dL (ref 3.5–5.0)
Alkaline Phosphatase: 69 U/L (ref 38–126)
Anion gap: 9 (ref 5–15)
BUN: 13 mg/dL (ref 8–23)
CO2: 25 mmol/L (ref 22–32)
Calcium: 9.5 mg/dL (ref 8.9–10.3)
Chloride: 104 mmol/L (ref 98–111)
Creatinine: 0.91 mg/dL (ref 0.61–1.24)
GFR, Est AFR Am: >60 mL/min (ref >60)
GFR, Estimated: >60 mL/min (ref >60)
Glucose, Bld: 79 mg/dL (ref 70–99)
Potassium: 4 mmol/L (ref 3.5–5.1)
Sodium: 138 mmol/L (ref 135–145)
Total Bilirubin: 0.4 mg/dL (ref 0.3–1.2)
Total Protein: 7.2 g/dL (ref 6.5–8.1)

## 2019-04-27 MED ORDER — PALONOSETRON HCL INJECTION 0.25 MG/5ML
INTRAVENOUS | Status: AC
  Filled 2019-04-27: qty 5

## 2019-04-27 MED ORDER — TRAMADOL HCL 50 MG PO TABS: 50.0000 mg | ORAL_TABLET | Freq: Four times a day (QID) | ORAL | 0 refills | Status: AC | PRN

## 2019-04-27 MED ORDER — HEPARIN SOD (PORK) LOCK FLUSH 100 UNIT/ML IV SOLN
500.0000 [IU] | Freq: Once | INTRAVENOUS | Status: DC | PRN
  Filled 2019-04-27: qty 5

## 2019-04-27 MED ORDER — SODIUM CHLORIDE 0.9 % IV SOLN
Freq: Once | INTRAVENOUS | Status: AC
  Administered 2019-04-27: 10:00:00 via INTRAVENOUS
  Filled 2019-04-27: qty 250

## 2019-04-27 MED ORDER — LIDOCAINE VISCOUS HCL 2 % MT SOLN: OROMUCOSAL | 5 refills | Status: AC

## 2019-04-27 MED ORDER — PALONOSETRON HCL INJECTION 0.25 MG/5ML
0.2500 mg | Freq: Once | INTRAVENOUS | Status: AC
  Administered 2019-04-27: 0.25 mg via INTRAVENOUS

## 2019-04-27 MED ORDER — SODIUM CHLORIDE 0.9 % IV SOLN
750.0000 mg | Freq: Once | INTRAVENOUS | Status: AC
  Administered 2019-04-27: 14:00:00 750 mg via INTRAVENOUS
  Filled 2019-04-27: qty 75

## 2019-04-27 MED ORDER — SODIUM CHLORIDE 0.9% FLUSH
10.0000 mL | INTRAVENOUS | Status: DC | PRN
  Filled 2019-04-27: qty 10

## 2019-04-27 MED ORDER — SODIUM CHLORIDE 0.9 % IV SOLN
75.0000 mg/m2 | Freq: Once | INTRAVENOUS | Status: AC
  Administered 2019-04-27: 12:00:00 160 mg via INTRAVENOUS
  Filled 2019-04-27: qty 16

## 2019-04-27 MED ORDER — SODIUM CHLORIDE 0.9 % IV SOLN
Freq: Once | INTRAVENOUS | Status: AC
  Administered 2019-04-27: 11:00:00 via INTRAVENOUS
  Filled 2019-04-27: qty 5

## 2019-04-27 MED ORDER — SODIUM CHLORIDE 0.9 % IV SOLN: 790.0000 mg | Freq: Once | INTRAVENOUS | Status: DC

## 2019-04-27 NOTE — Patient Instructions (Signed)
[facility] Discharge Instructions for Patients Receiving Chemotherapy  Today you received the following chemotherapy agents Taxotere, Carboplatin  To help prevent nausea and vomiting after your treatment, we encourage you to take your nausea medication    If you develop nausea and vomiting that is not controlled by your nausea medication, call the clinic.   BELOW ARE SYMPTOMS THAT SHOULD BE REPORTED IMMEDIATELY:  *FEVER GREATER THAN 100.5 F  *CHILLS WITH OR WITHOUT FEVER  NAUSEA AND VOMITING THAT IS NOT CONTROLLED WITH YOUR NAUSEA MEDICATION  *UNUSUAL SHORTNESS OF BREATH  *UNUSUAL BRUISING OR BLEEDING  TENDERNESS IN MOUTH AND THROAT WITH OR WITHOUT PRESENCE OF ULCERS  *URINARY PROBLEMS  *BOWEL PROBLEMS  UNUSUAL RASH Items with * indicate a potential emergency and should be followed up as soon as possible.  Feel free to call the clinic should you have any questions or concerns. The clinic phone number is [phone].  Please show the [facility] at check-in to the Emergency Department and triage nurse.

## 2019-04-27 NOTE — Telephone Encounter (Signed)
No change in appts per 7/14 los

## 2019-04-27 NOTE — Progress Notes (Signed)
[facility] OFFICE PROGRESS NOTE  Patient Care Team: Patient, [name] as PCP - General (General Practice) Melissa Montane, MD as Consulting Physician (Otolaryngology) Eppie Gibson, MD as Attending Physician (Radiation Oncology) Tish Men, MD as Consulting Physician (Hematology) Leota Sauers, RN as Oncology Nurse Navigator  HEME/ONC OVERVIEW: 1. 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, p16+; CPS 75  -12/2018: CT neck showed a left BOT lesion extending to the left vallecula, large left Level II/III multilobulated necrotic nodal conglomerate up to 5.3cm, resulting in encasement and displacement of the left carotid sheath, occlusion of the left IJ vein, and it is inseparable from multiple adjacent structures; suspicious for R cervical LN involvement  -01/2019: FNA of the left cervical LN showed poorly differentiated carcinoma, favoring squamous cell carcinoma -02/2019: PET showed FDG-avid left tongue lesion with advanced, bulky bilateral cervical LN disease, as well as a 3.2cm FDG-avid liver lesion, concerning for metastatic disease; liver bx showed met squamous cell carcinoma   PD-L1 positive but insufficient tissue for molecular studies  -03/2019 - present: carboplatin/docetaxel with G-CSF support due to bulky disease   2. 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 -S/p 2 cycles of carbo/Taxotere with G-CSF support in the palliative setting -Labs adequate today, proceed with Cycle 3 of chemotherapy today -Clinically, the bulky left cervical adenopathy has improved in size significantly, and his swallowing is improving, consistent with disease response  -We initially planned to obtain interim CT neck, CAP after the 3rd  cycle of chemotherapy to assess response; however, patient recently found out that [facility] is out of network for him, and he already has an appt with oncology at [facility] in ~2 weeks; given the clinical response, we will hold off scans until he can be established at [facility] to avoid excess costs for the patient  -Given the high CPS score, immunotherapy would be a very reasonable 2nd line option  -PRN anti-emetics: Zofran, Compazine and Ativan   Leukocytosis -Most likely secondary to G-CSF -Clinically, patient denies any symptoms of infection -WBC 13.9k, stable -We will monitor it for now  Chemotherapy-associated anemia -Secondary to chemotherapy -Hgb 10.4, stable -Patient denies any symptom of bleeding -We will monitor for now; no indication for dose adjustment -If anemia worsens in the future, we will consider delaying chemotherapy or adjusting chemotherapy dose  Chemotherapy-associated mucositis -Secondary to chemotherapy -Patient has been taking periodic ibuprofen and Tylenol for pain -I counseled the patient against taking Tylenol and NSAIDs due to the risk of masking fever -I have prescribed PRN viscous lidocaine swish and swallow, as well as tramadol PRN for pain  -I also emphasized the importance of maintaining good oral hygiene  Chemotherapy-associated nausea  -Secondary to chemotherapy -Symptoms relatively well controlled  -Continue PRN-anti-emetics  -I counseled the patient against taking lorazepam for sleep due to potential dependence  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  All questions were answered. The patient knows to call the clinic with any problems, questions or concerns. No barriers to learning was  detected.  Return in 3 weeks for labs, clinic appt and chemo as scheduled. If the patient establishes care at [facility] in time to start treatment, then okay to cancel appts at [facility].  Tish Men, MD 04/27/2019 10:24 AM  CHIEF COMPLAINT: "I am feeling much better"  INTERVAL HISTORY: 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  Patient reports that since starting chemotherapy, the bulky left cervical adenopathy has improved significantly (approximately 60 to 70% reduction in size).  His swallowing function is also improved markedly.  He still has some hoarseness of voice, but overall it is improving.  He has periodic nausea, for which he takes Zofran and Compazine as needed with relief.  He takes lorazepam at night mostly for sleep.  He had some pain in the throat after the first 2 cycles of chemotherapy, for which he has been taking PRN Tylenol and ibuprofen with relief of the pain.  He denies any other complaint today.  SUMMARY OF ONCOLOGIC HISTORY: Oncology History  Primary squamous cell carcinoma of base of tongue ([place])  01/08/2019 Imaging   CT neck: Findings are highly concerning for left tongue base malignancy with the largest left level II/III metastatic nodal conglomerate measuring up to 5.3 cm in greatest axial dimension resulting in encasement and displacement of the left carotid sheath, occlusion of the left internal jugular vein, and is inseparable from multiple adjacent structures as detailed in the body of the report.  Findings are concerning for additional right level IIA, right level II/III, left level III, right level IV, and possibly subcentimeter left level V nodal metastases as detailed above. PET/CT could further evaluate the extent of metastatic disease if clinically warranted.   01/21/2019 Pathology Results   Final Cytologic Interpretation  Left neck, Fine Needle Aspiration I (smears  and cell block): Porrly differentiatd carcinoma, favor squamous cell carcinoma. See comment.   02/18/2019 Imaging   PET: IMPRESSION: 16 mm lesion at the left base of tongue, corresponding to the patient's known primary head/neck malignancy, similar to prior CT.  Bilateral cervical nodal metastases, as described above, including a dominant 5.3 cm left level 2/3 nodal mass, grossly unchanged.  3.2 cm hepatic metastasis in segment 4A.   03/02/2019 Pathology Results   US-guided liver bx: Liver, needle/core biopsy, Segment 4 A - METASTATIC SQUAMOUS CELL CARCINOMA - SEE COMMENT   03/16/2019 -  Chemotherapy   The patient had palonosetron (ALOXI) injection 0.25 mg, 0.25 mg, Intravenous,  Once, 3 of 6 cycles Administration: 0.25 mg (03/16/2019), 0.25 mg (04/06/2019) pegfilgrastim (NEULASTA) injection 6 mg, 6 mg, Subcutaneous, Once, 1 of 1 cycle Administration: 6 mg (04/08/2019) pegfilgrastim-cbqv (UDENYCA) injection 6 mg, 6 mg, Subcutaneous, Once, 2 of 5 cycles Administration: 6 mg (03/18/2019) CARBOplatin (PARAPLATIN) 650 mg in sodium chloride 0.9 % 250 mL chemo infusion, 650 mg (100 % of original dose 647.4 mg), Intravenous,  Once, 3 of 6 cycles Dose modification:   (original dose 647.4 mg, Cycle 1) Administration: 650 mg (03/16/2019), 650 mg (04/06/2019) DOCEtaxel (TAXOTERE) 160 mg in sodium chloride 0.9 % 250 mL chemo infusion, 75 mg/m2 = 160 mg, Intravenous,  Once, 3 of 6 cycles Administration: 160 mg (03/16/2019), 160 mg (04/06/2019) fosaprepitant (EMEND) 150 mg, dexamethasone (DECADRON) 12 mg in sodium chloride 0.9 % 145 mL IVPB, , Intravenous,  Once, 3 of 6 cycles Administration:  (03/16/2019),  (04/06/2019)  for chemotherapy treatment.      REVIEW OF SYSTEMS:   Constitutional: ( - )  fevers, ( - )  chills , ( - ) night sweats Eyes: ( - ) blurriness of vision, ( - ) double vision, ( - ) watery eyes Ears, nose, mouth, throat, and face: ( - ) mucositis, ( - ) sore throat Respiratory: ( - )  cough, ( - ) dyspnea, ( - ) wheezes Cardiovascular: ( - ) palpitation, ( - ) chest discomfort, ( - ) lower extremity swelling Gastrointestinal:  ( - ) nausea, ( - ) heartburn, ( - ) change in bowel habits Skin: ( - ) abnormal skin rashes Lymphatics: ( - ) new lymphadenopathy, ( - ) easy bruising Neurological: ( - ) numbness, ( - ) tingling, ( - ) new weaknesses Behavioral/Psych: ( - ) mood change, ( - ) new changes  All other systems were reviewed with the patient and are negative.  I have reviewed the past medical history, past surgical history, social history and family history with the patient and they are unchanged from previous note.  ALLERGIES:  is allergic to iodinated diagnostic agents.  MEDICATIONS:  Current Outpatient Medications  Medication Sig Dispense Refill  . dexamethasone (DECADRON) 4 MG tablet Take 2 tablets (8 mg total) by mouth 2 (two) times daily. Start the day before Taxotere. Then again the day after chemo for 3 days. 30 tablet 1  . lidocaine-prilocaine (EMLA) cream Apply to affected area once 30 g 3  . loratadine (CLARITIN) 10 MG tablet Take 10 mg by mouth daily.    Marland Kitchen LORazepam (ATIVAN) 0.5 MG tablet TAKE 1 TABLET BY MOUTH EVERY 6 HOURS AS NEEDED FOR NAUSEA OR  VOMITING 30 tablet 0  . ondansetron (ZOFRAN) 8 MG tablet Take 1 tablet (8 mg total) by mouth 2 (two) times daily as needed for refractory nausea / vomiting. Start on day 3 after chemo. 30 tablet 1  . prochlorperazine (COMPAZINE) 10 MG tablet Take 1 tablet (10 mg total) by mouth every 6 (six) hours as needed (Nausea or vomiting). 30 tablet 1  . acetaminophen (TYLENOL) 325 MG tablet Take 650 mg by mouth every 4 (four) hours as needed for mild pain.    Marland Kitchen ibuprofen (ADVIL) 200 MG tablet Take 200 mg by mouth every 6 (six) hours as needed for moderate pain.    Marland Kitchen lidocaine (XYLOCAINE) 2 % solution Patient: Mix 1part 2% viscous lidocaine, 1part H20. Swish & swallow 82m of diluted mixture, 335m before meals and at  bedtime, up to QID 100 mL 5  . traMADol (ULTRAM) 50 MG tablet Take 1 tablet (50 mg total) by mouth every 6 (six) hours as needed. 60 tablet 0   No current facility-administered medications for this visit.    Facility-Administered Medications Ordered in Other Visits  Medication Dose Route Frequency Provider Last Rate Last Dose  . CARBOplatin (PARAPLATIN) 789.6 mg in sodium chloride 0.9 % 250 mL chemo infusion  789.6 mg Intravenous Once ZhTish MenMD      . DOCEtaxel (TAXOTERE) 160 mg in sodium chloride 0.9 % 250 mL chemo infusion  75 mg/m2 (Treatment Plan Recorded) Intravenous Once ZhTish MenMD      . fosaprepitant (EMEND) 150 mg, dexamethasone (DECADRON) 12 mg in sodium chloride 0.9 % 145 mL IVPB   Intravenous Once ZhTish MenMD      . heparin lock flush 100 unit/mL  500 Units Intracatheter Once PRN ZhTish MenMD      . palonosetron (ALeona Carryinjection 0.25 mg  0.25 mg Intravenous Once ZhTish MenMD      .  sodium chloride flush (NS) 0.9 % injection 10 mL  10 mL Intracatheter PRN Tish Men, MD        PHYSICAL EXAMINATION: ECOG PERFORMANCE STATUS: 0 - Asymptomatic  Today's Vitals   04/27/19 0944  BP: 139/87  Pulse: 91  Resp: 18  Temp: 98.1 F (36.7 C)  TempSrc: Temporal  SpO2: 98%  Weight: 188 lb 4 oz (85.4 kg)  Height: '5\' 11"'$  (1.803 m)  PainSc: 0-No pain   Body mass index is 26.26 kg/m.  Filed Weights   04/27/19 0944  Weight: 188 lb 4 oz (85.4 kg)    GENERAL: alert, no distress and comfortable SKIN: skin color, texture, turgor are normal, no rashes or significant lesions EYES: conjunctiva are pink and non-injected, sclera clear OROPHARYNX: no exudate, no erythema; lips, buccal mucosa, and tongue normal  NECK: supple, non-tender LYMPH:  no palpable lymphadenopathy in the cervical LUNGS: clear to auscultation with normal breathing effort HEART: regular rate & rhythm and no murmurs and no lower extremity edema ABDOMEN: soft, non-tender, non-distended, normal bowel  sounds Musculoskeletal: no cyanosis of digits and no clubbing  PSYCH: alert & oriented x 3, fluent speech NEURO: no focal motor/sensory deficits  LABORATORY DATA:  I have reviewed the data as listed    Component Value Date/Time   NA 138 04/27/2019 0900   K 4.0 04/27/2019 0900   CL 104 04/27/2019 0900   CO2 25 04/27/2019 0900   GLUCOSE 79 04/27/2019 0900   BUN 13 04/27/2019 0900   CREATININE 0.91 04/27/2019 0900   CALCIUM 9.5 04/27/2019 0900   PROT 7.2 04/27/2019 0900   ALBUMIN 4.5 04/27/2019 0900   AST 15 04/27/2019 0900   ALT 16 04/27/2019 0900   ALKPHOS 69 04/27/2019 0900   BILITOT 0.4 04/27/2019 0900   GFRNONAA >60 04/27/2019 0900   GFRAA >60 04/27/2019 0900    No results found for: SPEP, UPEP  Lab Results  Component Value Date   WBC 13.9 (H) 04/27/2019   NEUTROABS 12.3 (H) 04/27/2019   HGB 10.4 (L) 04/27/2019   HCT 31.9 (L) 04/27/2019   MCV 101.6 (H) 04/27/2019   PLT 186 04/27/2019      Chemistry      Component Value Date/Time   NA 138 04/27/2019 0900   K 4.0 04/27/2019 0900   CL 104 04/27/2019 0900   CO2 25 04/27/2019 0900   BUN 13 04/27/2019 0900   CREATININE 0.91 04/27/2019 0900      Component Value Date/Time   CALCIUM 9.5 04/27/2019 0900   ALKPHOS 69 04/27/2019 0900   AST 15 04/27/2019 0900   ALT 16 04/27/2019 0900   BILITOT 0.4 04/27/2019 0900       RADIOGRAPHIC STUDIES: I have personally reviewed the radiological images as listed below and agreed with the findings in the report. Dg Swallow Func Speech Path  Result Date: 04/02/2019 Objective Swallowing Evaluation: Type of Study: MBS-Modified Barium Swallow Study  Patient Details Name: Jack Price MRN: [id number] Date of Birth: 1956-05-13 Today's Date: 04/02/2019 Time: SLP Start Time (ACUTE ONLY): 1120 -SLP Stop Time (ACUTE ONLY): 1140 SLP Time Calculation (min) (ACUTE ONLY): 20 min Past Medical History: Past Medical History: Diagnosis Date . Cancer ([place])  . 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 IR IMAGING GUIDED PORT INSERTION  03/12/2019 HPI: Pt arrives for an outpatient MBS due to Stage IV  poorly differentiated carcinoma of the left base of the tongue with  mets to the liver.  CT neck showed a left BOT lesion extending to the left vallecula, large left Level II/III multilobulated necrotic nodal conglomerate up to 5.3cm, resulting in encasement and displacement of the left carotid sheath, occlusion of the left IJ vein, and it is inseparable from multiple adjacent structures; suspicious for R cervical LN involvement. Pt has Horner's syndrome, reports coughing with water. Palliative treatment, not curative.  No data recorded Assessment / Plan / Recommendation CHL IP CLINICAL IMPRESSIONS 04/02/2019 Clinical Impression Pt demosntrates no oropharyngeal dysphagia. No deficits noted other than hoarse vocal quality. Vallecular cavity still fully patent. 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 Suspect that pt may have occasional trace aspiration events if swallow is slightly mistimed. Small sips and increased attention to swallowing are adeuquate interventions for now. If function worsens pt may experiment with a head turn or chin tuck, but may require f/u with SLP in the future. No interventions needed at this time.  SLP Visit Diagnosis -- Attention and concentration deficit following -- Frontal lobe and executive function deficit following -- Impact on safety and function Mild aspiration risk   CHL IP TREATMENT RECOMMENDATION 04/02/2019 Treatment Recommendations No treatment recommended at this time   No flowsheet data found. CHL IP DIET RECOMMENDATION 04/02/2019 SLP Diet Recommendations Regular solids;Thin liquid Liquid Administration via Cup;Straw Medication Administration -- Compensations -- Postural Changes --   No flowsheet data found.  CHL  IP FOLLOW UP RECOMMENDATIONS 04/02/2019 Follow up Recommendations None   No flowsheet data found.     CHL IP ORAL PHASE 04/02/2019 Oral Phase WFL Oral - Pudding Teaspoon -- Oral - Pudding Cup -- Oral - Honey Teaspoon -- Oral - Honey Cup -- Oral - Nectar Teaspoon -- Oral - Nectar Cup -- Oral - Nectar Straw -- Oral - Thin Teaspoon -- Oral - Thin Cup -- Oral - Thin Straw -- Oral - Puree -- Oral - Mech Soft -- Oral - Regular -- Oral - Multi-Consistency -- Oral - Pill -- Oral Phase - Comment --  CHL IP PHARYNGEAL PHASE 04/02/2019 Pharyngeal Phase WFL Pharyngeal- Pudding Teaspoon -- Pharyngeal -- Pharyngeal- Pudding Cup -- Pharyngeal -- Pharyngeal- Honey Teaspoon -- Pharyngeal -- Pharyngeal- Honey Cup -- Pharyngeal -- Pharyngeal- Nectar Teaspoon -- Pharyngeal -- Pharyngeal- Nectar Cup -- Pharyngeal -- Pharyngeal- Nectar Straw -- Pharyngeal -- Pharyngeal- Thin Teaspoon -- Pharyngeal -- Pharyngeal- Thin Cup -- Pharyngeal -- Pharyngeal- Thin Straw -- Pharyngeal -- Pharyngeal- Puree -- Pharyngeal -- Pharyngeal- Mechanical Soft -- Pharyngeal -- Pharyngeal- Regular -- Pharyngeal -- Pharyngeal- Multi-consistency -- Pharyngeal -- Pharyngeal- Pill -- Pharyngeal -- Pharyngeal Comment --  CHL IP CERVICAL ESOPHAGEAL PHASE 04/02/2019 Cervical Esophageal Phase WFL Pudding Teaspoon -- Pudding Cup -- Honey Teaspoon -- Honey Cup -- Nectar Teaspoon -- Nectar Cup -- Nectar Straw -- Thin Teaspoon -- Thin Cup -- Thin Straw -- Puree -- Mechanical Soft -- Regular -- Multi-consistency -- Pill -- Cervical Esophageal Comment -- Herbie Baltimore, MA CCC-SLP Acute Rehabilitation Services Pager [phone] Office [phone] Lynann Beaver 04/02/2019, 12:25 PM

## 2019-04-29 ENCOUNTER — Other Ambulatory Visit: Payer: Self-pay

## 2019-04-29 ENCOUNTER — Inpatient Hospital Stay: Payer: BLUE CROSS/BLUE SHIELD

## 2019-04-29 VITALS — BP 119/76 | HR 67 | Temp 97.7°F | Resp 17

## 2019-04-29 DIAGNOSIS — C01 Malignant neoplasm of base of tongue: Secondary | ICD-10-CM

## 2019-04-29 MED ORDER — PEGFILGRASTIM-CBQV 6 MG/0.6ML ~~LOC~~ SOSY
PREFILLED_SYRINGE | SUBCUTANEOUS | Status: AC
  Filled 2019-04-29: qty 0.6

## 2019-04-29 MED ORDER — PEGFILGRASTIM INJECTION 6 MG/0.6ML ~~LOC~~
PREFILLED_SYRINGE | SUBCUTANEOUS | Status: AC
  Filled 2019-04-29: qty 0.6

## 2019-04-29 MED ORDER — PEGFILGRASTIM-CBQV 6 MG/0.6ML ~~LOC~~ SOSY
6.0000 mg | PREFILLED_SYRINGE | Freq: Once | SUBCUTANEOUS | Status: AC
  Administered 2019-04-29: 6 mg via SUBCUTANEOUS

## 2019-04-29 NOTE — Patient Instructions (Signed)

## 2019-05-07 ENCOUNTER — Other Ambulatory Visit: Payer: Self-pay | Admitting: Hematology

## 2019-05-07 DIAGNOSIS — C01 Malignant neoplasm of base of tongue: Secondary | ICD-10-CM

## 2019-05-18 ENCOUNTER — Ambulatory Visit: Payer: BLUE CROSS/BLUE SHIELD

## 2019-05-18 ENCOUNTER — Other Ambulatory Visit: Payer: BLUE CROSS/BLUE SHIELD

## 2019-05-18 ENCOUNTER — Ambulatory Visit: Payer: BLUE CROSS/BLUE SHIELD | Admitting: Hematology

## 2019-05-18 ENCOUNTER — Inpatient Hospital Stay: Payer: BLUE CROSS/BLUE SHIELD

## 2019-05-20 ENCOUNTER — Ambulatory Visit: Payer: BLUE CROSS/BLUE SHIELD

## 2019-06-03 ENCOUNTER — Other Ambulatory Visit: Payer: Self-pay | Admitting: Hematology

## 2019-06-03 DIAGNOSIS — C01 Malignant neoplasm of base of tongue: Secondary | ICD-10-CM

## 2020-06-27 ENCOUNTER — Ambulatory Visit: Payer: BLUE CROSS/BLUE SHIELD

## 2020-06-27 ENCOUNTER — Ambulatory Visit: Payer: BLUE CROSS/BLUE SHIELD | Attending: Internal Medicine

## 2020-06-27 DIAGNOSIS — Z23 Encounter for immunization: Secondary | ICD-10-CM

## 2020-06-27 NOTE — Progress Notes (Signed)
   Covid-19 Vaccination Clinic  Name:  Rafiel Mecca    MRN: [id number] DOB: 01/30/1956  06/27/2020  Mr. Kidd was observed post Covid-19 immunization for 15 minutes without incident. He was provided with Vaccine Information Sheet and instruction to access the V-Safe system.   Mr. Gruenberg was instructed to call [phone] with any severe reactions post vaccine: Marland Kitchen Difficulty breathing  . Swelling of face and throat  . A fast heartbeat  . A bad rash all over body  . Dizziness and weakness

## 2020-12-03 IMAGING — CT CT HEAD WITHOUT CONTRAST
3 series · 16 of 47 positions shown, 19 images · non-contrast
Comparison: None.

CLINICAL DATA: Loss of consciousness after fall today.

EXAM:
CT HEAD WITHOUT CONTRAST
TECHNIQUE: Contiguous axial images were obtained from the base of the skull
through the vertex without intravenous contrast.

[Series 2: head wo · axial · 0.49mm/px · z∈[-172,-27]mm · 10 of 35 slices shown, 13 images]
[im 3/35  brain]
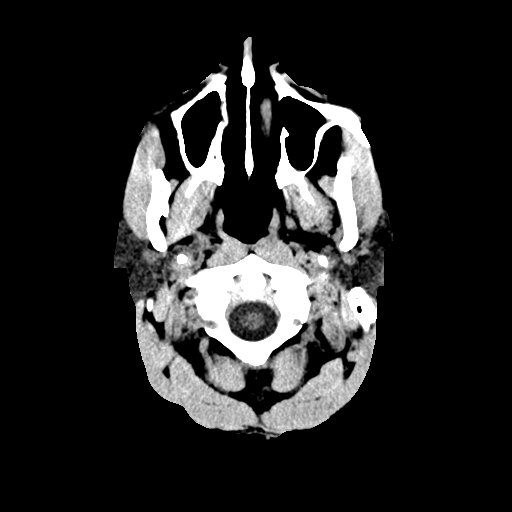
[im 3/35  bone]
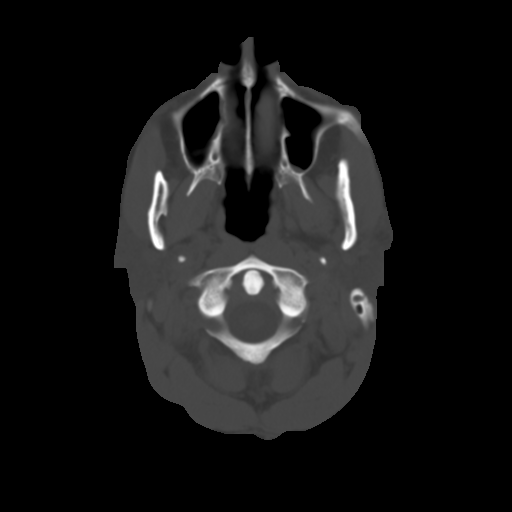
[im 6/35  brain]
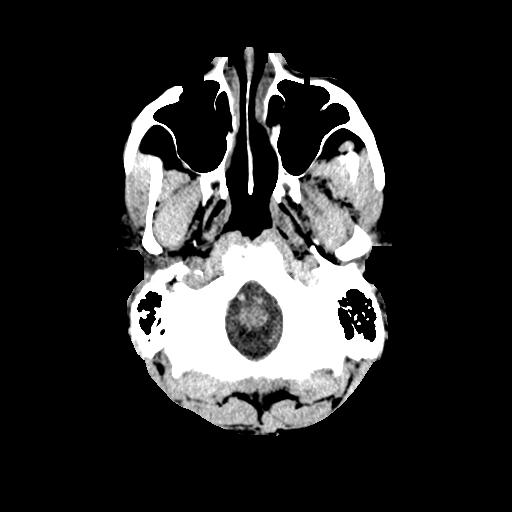
[im 10/35  brain]
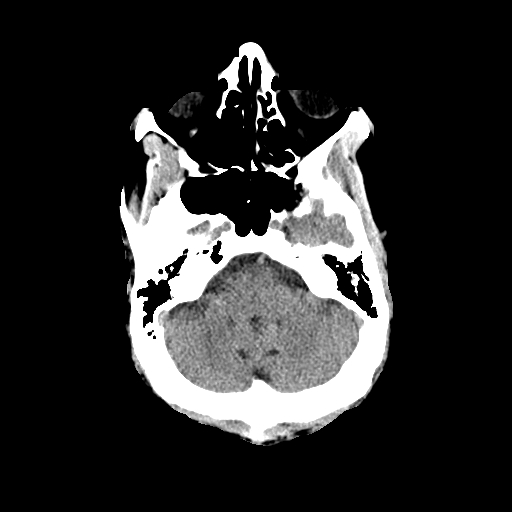
[im 12/35  brain]
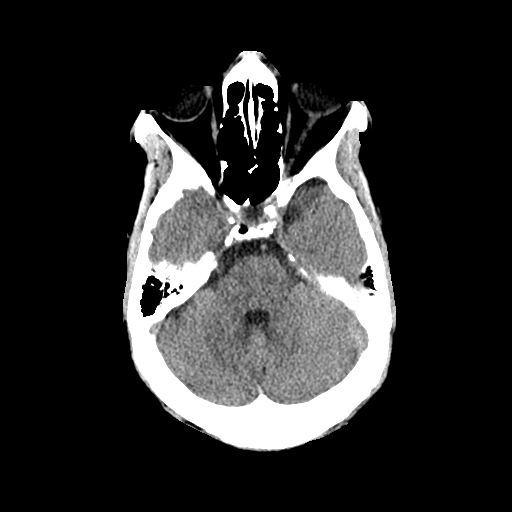
[im 16/35  brain]
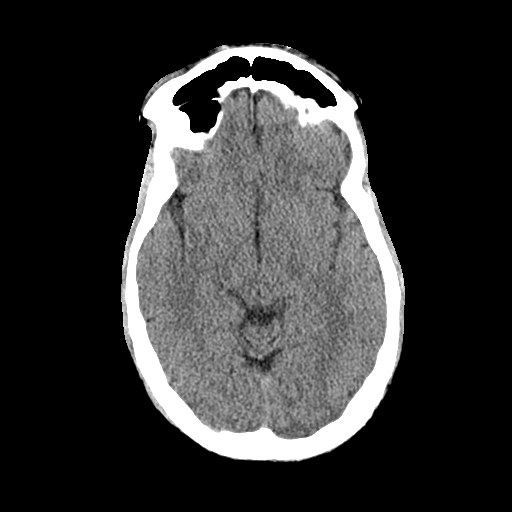
[im 16/35  bone]
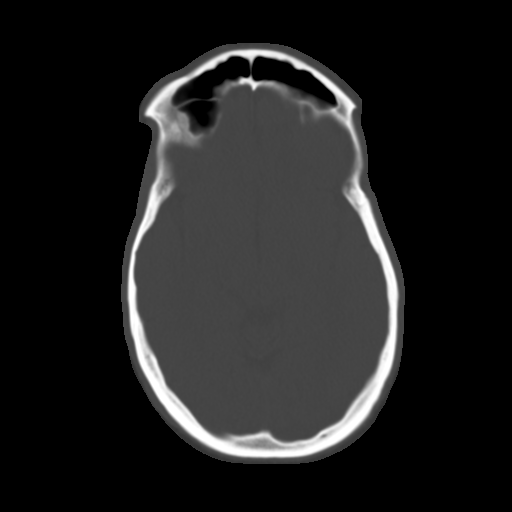
[im 19/35  brain]
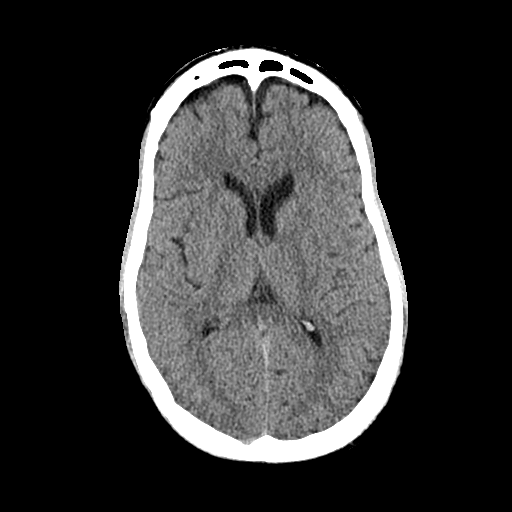
[im 23/35  brain]
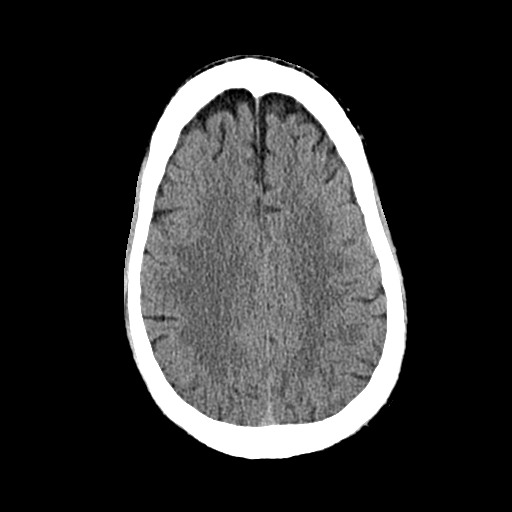
[im 26/35  brain]
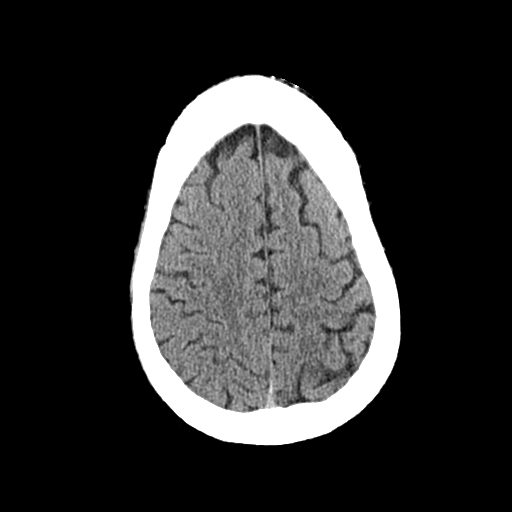
[im 29/35  brain]
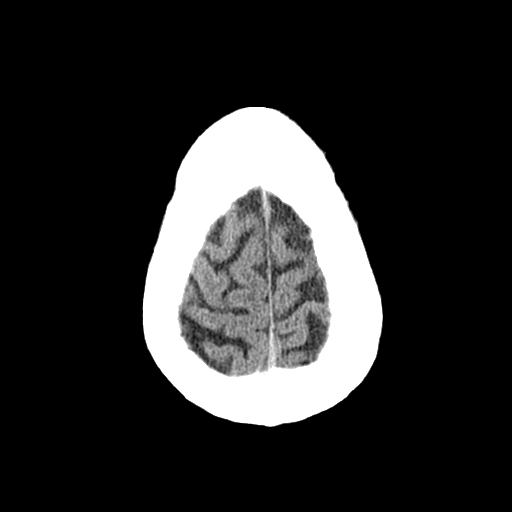
[im 29/35  bone]
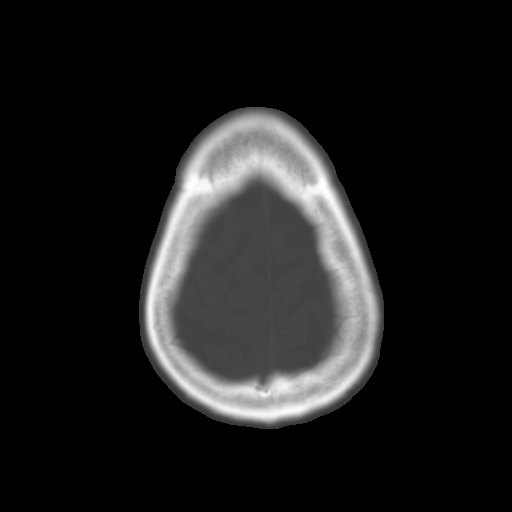
[im 32/35  brain]
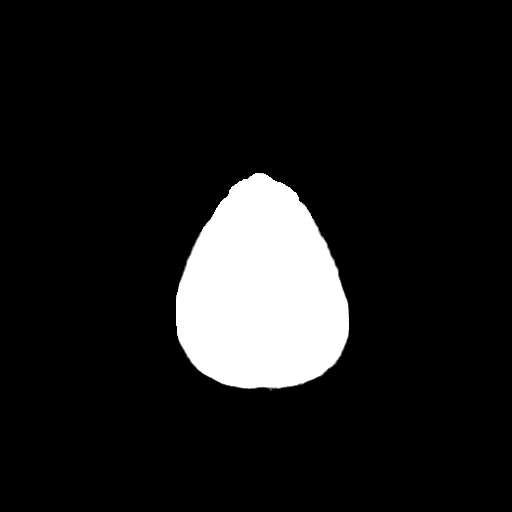

[Series 4: cor soft · coronal · 0.39mm/px · 3 of 70 slices shown]
[im 24/70  brain]
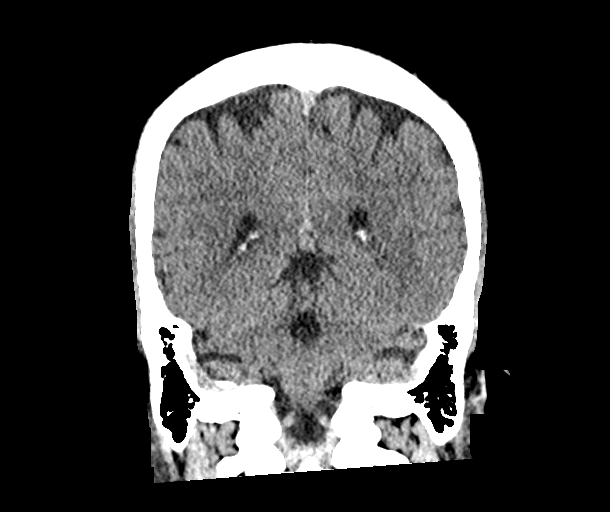
[im 31/70  brain]
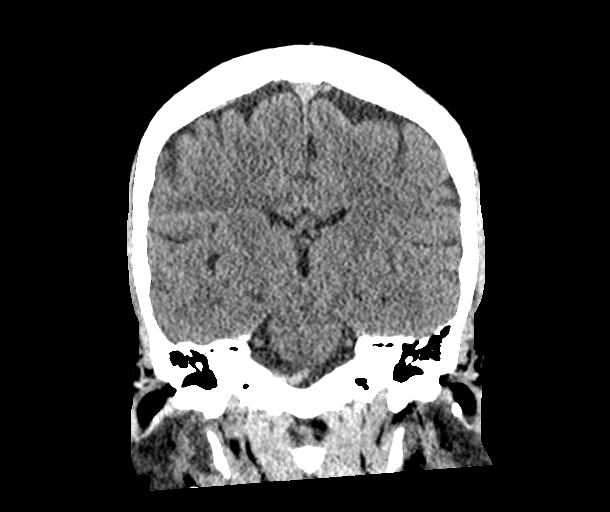
[im 39/70  brain]
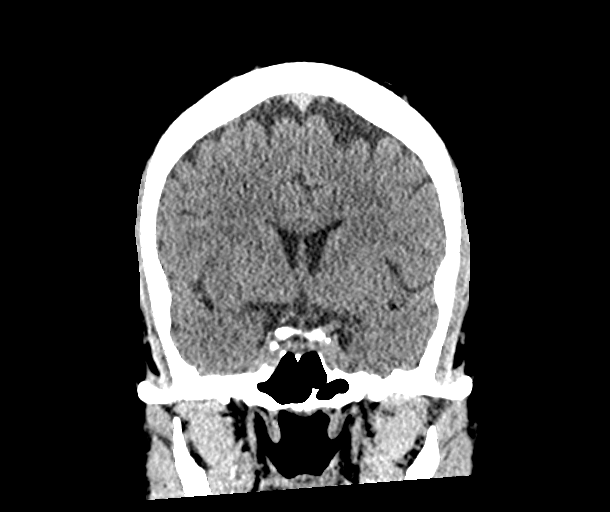

[Series 5: sag soft · sagittal · 0.39mm/px · 3 of 80 slices shown]
[im 27/80  brain]
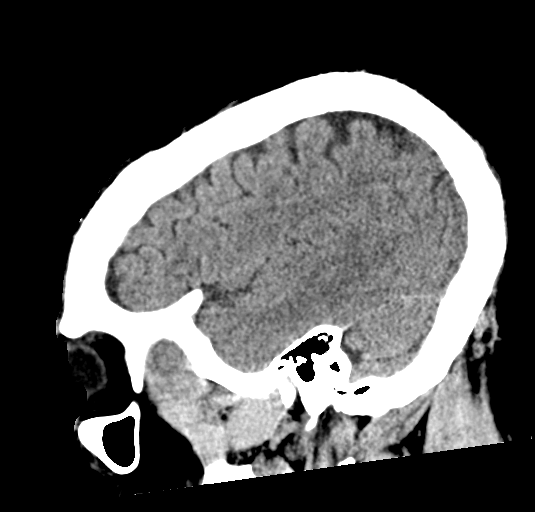
[im 40/80  brain]
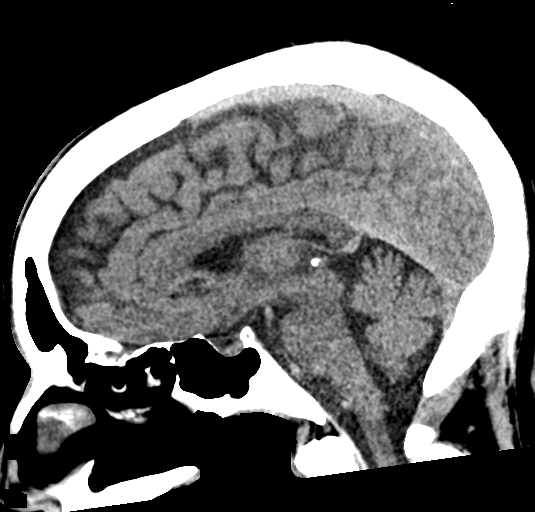
[im 53/80  brain]
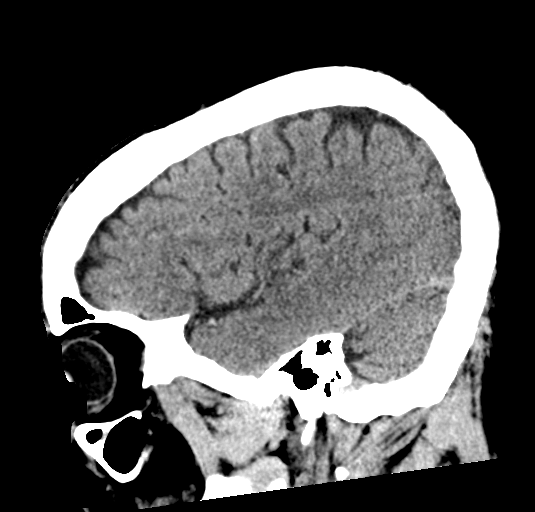

[16 of 47 positions shown; findings below may reference images not displayed]

FINDINGS: Brain: No evidence of acute infarction, hemorrhage, hydrocephalus,
extra-axial collection or mass lesion/mass effect.

Vascular: No hyperdense vessel or unexpected calcification.

Skull: Normal. Negative for fracture or focal lesion.

Sinuses/Orbits: No acute finding.

Other: None.
IMPRESSION: Normal head CT.

## 2022-05-12 ENCOUNTER — Other Ambulatory Visit: Payer: Self-pay

## 2024-11-12 ENCOUNTER — Other Ambulatory Visit: Payer: Self-pay

## 2025-01-06 ENCOUNTER — Ambulatory Visit (HOSPITAL_BASED_OUTPATIENT_CLINIC_OR_DEPARTMENT_OTHER): Admitting: Cardiovascular Disease
